# Patient Record
Sex: Female | Born: 1985
Health system: Southern US, Community
[De-identification: ages and names within clinical notes are randomized; demographics above are authoritative.]

## PROBLEM LIST (undated history)

## (undated) ENCOUNTER — Ambulatory Visit

## (undated) DIAGNOSIS — R509 Fever, unspecified: Secondary | ICD-10-CM

## (undated) HISTORY — PX: WISDOM TOOTH EXTRACTION: SHX21

---

## 2003-02-03 ENCOUNTER — Ambulatory Visit (HOSPITAL_COMMUNITY): Admission: RE | Admit: 2003-02-03 | Discharge: 2003-02-03 | Payer: Self-pay | Admitting: Family Medicine

## 2003-08-17 ENCOUNTER — Ambulatory Visit (HOSPITAL_COMMUNITY): Admission: RE | Admit: 2003-08-17 | Discharge: 2003-08-17 | Payer: Self-pay | Admitting: Family Medicine

## 2005-03-07 ENCOUNTER — Emergency Department (HOSPITAL_COMMUNITY): Admission: EM | Admit: 2005-03-07 | Discharge: 2005-03-07 | Payer: Self-pay | Admitting: Emergency Medicine

## 2005-04-04 ENCOUNTER — Inpatient Hospital Stay (HOSPITAL_COMMUNITY): Admission: AD | Admit: 2005-04-04 | Discharge: 2005-04-07 | Payer: Self-pay | Admitting: Obstetrics

## 2006-03-12 ENCOUNTER — Emergency Department (HOSPITAL_COMMUNITY): Admission: EM | Admit: 2006-03-12 | Discharge: 2006-03-12 | Payer: Self-pay | Admitting: Emergency Medicine

## 2006-05-01 ENCOUNTER — Inpatient Hospital Stay (HOSPITAL_COMMUNITY): Admission: AD | Admit: 2006-05-01 | Discharge: 2006-05-02 | Payer: Self-pay | Admitting: Obstetrics

## 2007-02-19 ENCOUNTER — Emergency Department (HOSPITAL_COMMUNITY): Admission: EM | Admit: 2007-02-19 | Discharge: 2007-02-19 | Payer: Self-pay | Admitting: Emergency Medicine

## 2007-12-04 ENCOUNTER — Emergency Department (HOSPITAL_COMMUNITY): Admission: EM | Admit: 2007-12-04 | Discharge: 2007-12-04 | Payer: Self-pay | Admitting: Emergency Medicine

## 2010-01-09 LAB — HEPATITIS B SURFACE ANTIGEN: Hepatitis B Surface Ag: NEGATIVE

## 2010-01-09 LAB — ABO/RH: RH Type: NEGATIVE

## 2010-01-09 LAB — RPR: RPR: NONREACTIVE

## 2010-01-21 ENCOUNTER — Inpatient Hospital Stay (HOSPITAL_COMMUNITY)
Admission: AD | Admit: 2010-01-21 | Discharge: 2010-01-21 | Payer: Self-pay | Source: Home / Self Care | Attending: Obstetrics | Admitting: Obstetrics

## 2010-02-04 NOTE — L&D Delivery Note (Signed)
Delivery Note At 4:17 PM a viable female was delivered via Vaginal, Spontaneous Delivery (Presentation: Left Occiput Anterior).  APGAR: 1, 8; weight 8 lb 2.7 oz (3705 g).   Placenta status: Intact, Spontaneous.  Cord: 3 vessels with the following complications: None.  Anesthesia: Epidural  Episiotomy: None Lacerations: None Suture Repair: none Est. Blood Loss (mL):   Mom to postpartum.  Baby to nursery-stable.  Bailey King A 08/14/2010, 4:41 PM

## 2010-04-16 LAB — URINALYSIS, ROUTINE W REFLEX MICROSCOPIC
Glucose, UA: NEGATIVE mg/dL
Hgb urine dipstick: NEGATIVE
Specific Gravity, Urine: 1.015 (ref 1.005–1.030)
pH: 7 (ref 5.0–8.0)

## 2010-04-16 LAB — CBC
HCT: 36.4 % (ref 36.0–46.0)
MCV: 87.7 fL (ref 78.0–100.0)
RBC: 4.15 MIL/uL (ref 3.87–5.11)
WBC: 10 10*3/uL (ref 4.0–10.5)

## 2010-04-16 LAB — WET PREP, GENITAL: Yeast Wet Prep HPF POC: NONE SEEN

## 2010-04-16 LAB — ABO/RH: ABO/RH(D): AB POS

## 2010-05-21 ENCOUNTER — Inpatient Hospital Stay (HOSPITAL_COMMUNITY)
Admission: AD | Admit: 2010-05-21 | Discharge: 2010-05-21 | Disposition: A | Discharge status: Home / Self Care | Payer: Medicaid Other | Source: Ambulatory Visit | Attending: Obstetrics | Admitting: Obstetrics

## 2010-05-21 DIAGNOSIS — O99891 Other specified diseases and conditions complicating pregnancy: Secondary | ICD-10-CM

## 2010-05-21 DIAGNOSIS — R0602 Shortness of breath: Secondary | ICD-10-CM

## 2010-05-21 DIAGNOSIS — O9989 Other specified diseases and conditions complicating pregnancy, childbirth and the puerperium: Secondary | ICD-10-CM

## 2010-08-07 ENCOUNTER — Inpatient Hospital Stay (HOSPITAL_COMMUNITY): Payer: Managed Care, Other (non HMO)

## 2010-08-07 ENCOUNTER — Inpatient Hospital Stay (HOSPITAL_COMMUNITY)
Admission: AD | Admit: 2010-08-07 | Discharge: 2010-08-07 | Disposition: A | Discharge status: Home / Self Care | Payer: Managed Care, Other (non HMO) | Source: Ambulatory Visit | Attending: Obstetrics | Admitting: Obstetrics

## 2010-08-07 DIAGNOSIS — O4690 Antepartum hemorrhage, unspecified, unspecified trimester: Secondary | ICD-10-CM | POA: Insufficient documentation

## 2010-08-13 ENCOUNTER — Inpatient Hospital Stay (HOSPITAL_COMMUNITY)
Admission: AD | Admit: 2010-08-13 | Discharge: 2010-08-16 | DRG: 775 | Disposition: A | Discharge status: Home / Self Care | Payer: Managed Care, Other (non HMO) | Source: Ambulatory Visit | Attending: Obstetrics | Admitting: Obstetrics

## 2010-08-13 ENCOUNTER — Inpatient Hospital Stay (HOSPITAL_COMMUNITY)
Admission: RE | Admit: 2010-08-13 | Discharge: 2010-08-13 | Disposition: A | Discharge status: Home / Self Care | Payer: Medicaid Other | Source: Ambulatory Visit | Attending: Obstetrics | Admitting: Obstetrics

## 2010-08-13 ENCOUNTER — Encounter (HOSPITAL_COMMUNITY): Payer: Self-pay | Admitting: *Deleted

## 2010-08-13 DIAGNOSIS — O479 False labor, unspecified: Secondary | ICD-10-CM | POA: Insufficient documentation

## 2010-08-13 DIAGNOSIS — R509 Fever, unspecified: Secondary | ICD-10-CM | POA: Diagnosis not present

## 2010-08-13 DIAGNOSIS — Z3490 Encounter for supervision of normal pregnancy, unspecified, unspecified trimester: Secondary | ICD-10-CM

## 2010-08-13 HISTORY — DX: Fever, unspecified: R50.9

## 2010-08-13 LAB — CBC
MCHC: 30.7 g/dL (ref 30.0–36.0)
MCV: 86.8 fL (ref 78.0–100.0)
Platelets: 306 10*3/uL (ref 150–400)
RDW: 14.1 % (ref 11.5–15.5)
WBC: 8.8 10*3/uL (ref 4.0–10.5)

## 2010-08-13 MED ORDER — OXYTOCIN 20 UNITS IN LACTATED RINGERS INFUSION - SIMPLE
2.0000 m[IU]/min | INTRAVENOUS | Status: DC
  Administered 2010-08-14 (×2): 2 m[IU]/min via INTRAVENOUS
  Administered 2010-08-14: 4 m[IU]/min via INTRAVENOUS
  Filled 2010-08-13: qty 1000

## 2010-08-13 MED ORDER — EPHEDRINE 5 MG/ML INJ
10.0000 mg | INTRAVENOUS | Status: DC | PRN
  Administered 2010-08-14: 10 mg via INTRAVENOUS
  Filled 2010-08-13 (×2): qty 4

## 2010-08-13 MED ORDER — DIPHENHYDRAMINE HCL 50 MG/ML IJ SOLN: 12.5000 mg | INTRAMUSCULAR | Status: DC | PRN

## 2010-08-13 MED ORDER — FENTANYL 2.5 MCG/ML BUPIVACAINE 1/10 % EPIDURAL INFUSION (WH - ANES)
2.0000 mL/h | INTRAMUSCULAR | Status: DC
  Administered 2010-08-14 (×4): 14 mL/h via EPIDURAL
  Filled 2010-08-13 (×4): qty 60

## 2010-08-13 MED ORDER — PHENYLEPHRINE 40 MCG/ML (10ML) SYRINGE FOR IV PUSH (FOR BLOOD PRESSURE SUPPORT)
80.0000 ug | PREFILLED_SYRINGE | INTRAVENOUS | Status: DC | PRN
  Filled 2010-08-13: qty 5

## 2010-08-13 MED ORDER — NALBUPHINE SYRINGE 5 MG/0.5 ML
10.0000 mg | INJECTION | INTRAMUSCULAR | Status: DC | PRN
  Filled 2010-08-13: qty 1

## 2010-08-13 MED ORDER — EPHEDRINE 5 MG/ML INJ
10.0000 mg | INTRAVENOUS | Status: DC | PRN
  Filled 2010-08-13: qty 4

## 2010-08-13 MED ORDER — LACTATED RINGERS IV SOLN
500.0000 mL | Freq: Once | INTRAVENOUS | Status: AC
  Administered 2010-08-14: 500 mL via INTRAVENOUS

## 2010-08-13 MED ORDER — PHENYLEPHRINE 40 MCG/ML (10ML) SYRINGE FOR IV PUSH (FOR BLOOD PRESSURE SUPPORT)
80.0000 ug | PREFILLED_SYRINGE | INTRAVENOUS | Status: DC | PRN
  Filled 2010-08-13 (×2): qty 5

## 2010-08-13 MED ORDER — NALBUPHINE HCL 10 MG/ML IJ SOLN: 10.0000 mg | INTRAMUSCULAR | Status: DC | PRN

## 2010-08-13 NOTE — H&P (Signed)
This is Dr. Francoise Ceo dictating the history and physical on Bailey King She's a gravida 2 para line all old one [facility] 08/13/2010 she has no known allergies and she was admitted for induction Her GBS is negative On admission has been having irregular contractions every 3-4 out metastases Cervix is 2 cm 80% and vertex -2 station Membranes ruptured artificially the fluid was clear Past medical history negative  Social history patient denies smoking drinking or alcohol abuse Family history negative System review negative Physical exam HEENT negative age Lungs clear to PA Breasts negative Heart regular in rhythm no murmurs no gallops no rubs Abdomen term Pelvic cervix is 2 cm percent vertex -2 station membranes ruptured artificially fluid to Extremities negative

## 2010-08-14 ENCOUNTER — Encounter (HOSPITAL_COMMUNITY): Payer: Self-pay | Admitting: *Deleted

## 2010-08-14 ENCOUNTER — Other Ambulatory Visit: Payer: Self-pay | Admitting: Obstetrics

## 2010-08-14 ENCOUNTER — Encounter (HOSPITAL_COMMUNITY): Payer: Self-pay | Admitting: Anesthesiology

## 2010-08-14 ENCOUNTER — Inpatient Hospital Stay (HOSPITAL_COMMUNITY): Payer: Managed Care, Other (non HMO) | Admitting: Anesthesiology

## 2010-08-14 DIAGNOSIS — R509 Fever, unspecified: Secondary | ICD-10-CM | POA: Diagnosis not present

## 2010-08-14 DIAGNOSIS — Z3490 Encounter for supervision of normal pregnancy, unspecified, unspecified trimester: Secondary | ICD-10-CM

## 2010-08-14 HISTORY — DX: Fever, unspecified: R50.9

## 2010-08-14 LAB — STREP B DNA PROBE
GBS: NEGATIVE
GBS: NEGATIVE

## 2010-08-14 MED ORDER — DIPHENHYDRAMINE HCL 25 MG PO CAPS: 25.0000 mg | ORAL_CAPSULE | Freq: Four times a day (QID) | ORAL | Status: DC | PRN

## 2010-08-14 MED ORDER — SODIUM CHLORIDE 0.9 % IV SOLN
2.0000 g | Freq: Four times a day (QID) | INTRAVENOUS | Status: DC
  Filled 2010-08-14: qty 2000

## 2010-08-14 MED ORDER — SODIUM CHLORIDE 0.9 % IJ SOLN: 3.0000 mL | Freq: Two times a day (BID) | INTRAMUSCULAR | Status: DC

## 2010-08-14 MED ORDER — ACETAMINOPHEN 160 MG/5ML PO SOLN
650.0000 mg | Freq: Four times a day (QID) | ORAL | Status: DC | PRN
  Administered 2010-08-14: 649.6 mg via ORAL
  Filled 2010-08-14: qty 20.3

## 2010-08-14 MED ORDER — RHO D IMMUNE GLOBULIN 1500 UNIT/2ML IJ SOLN: 300.0000 ug | Freq: Once | INTRAMUSCULAR | Status: DC

## 2010-08-14 MED ORDER — BENZOCAINE-MENTHOL 20-0.5 % EX AERO: 1.0000 "application " | INHALATION_SPRAY | CUTANEOUS | Status: DC | PRN

## 2010-08-14 MED ORDER — SIMETHICONE 80 MG PO CHEW: 80.0000 mg | CHEWABLE_TABLET | ORAL | Status: DC | PRN

## 2010-08-14 MED ORDER — LACTATED RINGERS IV SOLN: 500.0000 mL | INTRAVENOUS | Status: DC | PRN

## 2010-08-14 MED ORDER — WITCH HAZEL-GLYCERIN EX PADS: MEDICATED_PAD | CUTANEOUS | Status: DC | PRN

## 2010-08-14 MED ORDER — LIDOCAINE HCL 1.5 % IJ SOLN
INTRAMUSCULAR | Status: DC | PRN
  Administered 2010-08-14 (×2): 5 mL
  Administered 2010-08-14: 2 mL

## 2010-08-14 MED ORDER — SODIUM CHLORIDE 0.9 % IV SOLN
250.0000 mL | INTRAVENOUS | Status: DC
  Administered 2010-08-15: 250 mL via INTRAVENOUS

## 2010-08-14 MED ORDER — OXYCODONE-ACETAMINOPHEN 5-325 MG PO TABS: 1.0000 | ORAL_TABLET | ORAL | Status: DC | PRN

## 2010-08-14 MED ORDER — ZOLPIDEM TARTRATE 5 MG PO TABS: 5.0000 mg | ORAL_TABLET | Freq: Every evening | ORAL | Status: DC | PRN

## 2010-08-14 MED ORDER — OXYTOCIN 20 UNITS IN LACTATED RINGERS INFUSION - SIMPLE: 125.0000 mL/h | Freq: Once | INTRAVENOUS | Status: DC

## 2010-08-14 MED ORDER — FERROUS SULFATE 325 (65 FE) MG PO TABS
325.0000 mg | ORAL_TABLET | Freq: Two times a day (BID) | ORAL | Status: DC
  Filled 2010-08-14 (×2): qty 1

## 2010-08-14 MED ORDER — TETANUS-DIPHTH-ACELL PERTUSSIS 5-2.5-18.5 LF-MCG/0.5 IM SUSP
0.5000 mL | Freq: Once | INTRAMUSCULAR | Status: DC
  Filled 2010-08-14: qty 0.5

## 2010-08-14 MED ORDER — OXYTOCIN 20 UNITS IN LACTATED RINGERS INFUSION - SIMPLE: 125.0000 mL/h | INTRAVENOUS | Status: DC | PRN

## 2010-08-14 MED ORDER — ONDANSETRON HCL 4 MG/2ML IJ SOLN
INTRAMUSCULAR | Status: AC
  Administered 2010-08-14: 4 mg
  Filled 2010-08-14: qty 2

## 2010-08-14 MED ORDER — PRENATAL PLUS 27-1 MG PO TABS
1.0000 | ORAL_TABLET | Freq: Every day | ORAL | Status: DC
  Filled 2010-08-14: qty 1

## 2010-08-14 MED ORDER — IBUPROFEN 600 MG PO TABS: 600.0000 mg | ORAL_TABLET | Freq: Four times a day (QID) | ORAL | Status: DC

## 2010-08-14 MED ORDER — LIDOCAINE HCL (PF) 1 % IJ SOLN
30.0000 mL | Freq: Once | INTRAMUSCULAR | Status: AC | PRN
  Filled 2010-08-14 (×2): qty 30

## 2010-08-14 MED ORDER — ONDANSETRON HCL 4 MG PO TABS: 4.0000 mg | ORAL_TABLET | ORAL | Status: DC | PRN

## 2010-08-14 MED ORDER — CITRIC ACID-SODIUM CITRATE 334-500 MG/5ML PO SOLN: 30.0000 mL | ORAL | Status: DC | PRN

## 2010-08-14 MED ORDER — SODIUM CHLORIDE 0.9 % IV SOLN
2.0000 g | Freq: Four times a day (QID) | INTRAVENOUS | Status: DC
  Administered 2010-08-14: 2 g via INTRAVENOUS
  Filled 2010-08-14 (×2): qty 2000

## 2010-08-14 MED ORDER — ACETAMINOPHEN 325 MG PO TABS: 650.0000 mg | ORAL_TABLET | ORAL | Status: DC | PRN

## 2010-08-14 MED ORDER — METHYLERGONOVINE MALEATE 0.2 MG PO TABS: 0.2000 mg | ORAL_TABLET | ORAL | Status: DC | PRN

## 2010-08-14 MED ORDER — LACTATED RINGERS IV SOLN
INTRAVENOUS | Status: DC
  Administered 2010-08-14: 15:00:00 via INTRAVENOUS

## 2010-08-14 MED ORDER — IBUPROFEN 600 MG PO TABS: 600.0000 mg | ORAL_TABLET | Freq: Four times a day (QID) | ORAL | Status: DC | PRN

## 2010-08-14 MED ORDER — ONDANSETRON HCL 4 MG/2ML IJ SOLN: 4.0000 mg | INTRAMUSCULAR | Status: DC | PRN

## 2010-08-14 MED ORDER — SENNOSIDES-DOCUSATE SODIUM 8.6-50 MG PO TABS: 1.0000 | ORAL_TABLET | Freq: Every day | ORAL | Status: DC

## 2010-08-14 MED ORDER — ONDANSETRON HCL 4 MG/2ML IJ SOLN: 4.0000 mg | Freq: Four times a day (QID) | INTRAMUSCULAR | Status: DC | PRN

## 2010-08-14 MED ORDER — AMPICILLIN SODIUM 2 G IJ SOLR
2.0000 g | Freq: Four times a day (QID) | INTRAMUSCULAR | Status: DC
  Administered 2010-08-15: 2 g via INTRAVENOUS

## 2010-08-14 MED ORDER — FLEET ENEMA 7-19 GM/118ML RE ENEM: 1.0000 | ENEMA | RECTAL | Status: DC | PRN

## 2010-08-14 MED ORDER — ACETAMINOPHEN 325 MG PO TABS: 650.0000 mg | ORAL_TABLET | Freq: Four times a day (QID) | ORAL | Status: DC | PRN

## 2010-08-14 MED ORDER — SODIUM CHLORIDE 0.9 % IJ SOLN
3.0000 mL | INTRAMUSCULAR | Status: DC | PRN
  Administered 2010-08-15 (×3): 3 mL via INTRAVENOUS

## 2010-08-14 NOTE — Anesthesia Procedure Notes (Signed)
Epidural Patient location during procedure: OB Start time: 08/14/2010 3:02 AM Reason for block: procedure for pain  Staffing Anesthesiologist: Kamaria Lucia L. Performed by: anesthesiologist   Preanesthetic Checklist Completed: patient identified, site marked, surgical consent, pre-op evaluation, timeout performed, IV checked, risks and benefits discussed and monitors and equipment checked  Epidural Patient position: sitting Prep: site prepped and draped and DuraPrep Patient monitoring: blood pressure, continuous pulse ox and heart rate Approach: midline Injection technique: LOR air  Needle Needle type: Tuohy  Needle gauge: 17 G Needle length: 9 cm Catheter type: closed end flexible Catheter size: 19 Gauge Test dose: 1.5% lidocaine  Assessment Events: blood not aspirated, injection not painful, no injection resistance, negative IV test and no paresthesia  Additional Notes Discussed risk of headache, infection, bleeding, nerve injury and failed or incomplete block.  Patient voices understanding and wishes to proceed.

## 2010-08-14 NOTE — Anesthesia Preprocedure Evaluation (Signed)
Anesthesia Evaluation  Name, MR# and DOBGeneral Assessment Comment  Reviewed: Allergy & Precautions, H&P  and Patient's Chart, lab work & pertinent test results  History of Anesthesia Complications Negative for: history of anesthetic complications  Airway Mallampati: II      Dental   Pulmonaryneg pulmonary ROS    clear to auscultation  pulmonary exam normal   Cardiovascular regular Normal   Neuro/PsychNegative Neurological ROS Negative Psych ROS  GI/Hepatic/Renal negative GI ROS, negative Liver ROS, and negative Renal ROS (+)       Endo/Other  Negative Endocrine ROS (+)   Abdominal   Musculoskeletal  Hematology negative hematology ROS (+)   Peds  Reproductive/Obstetrics (+) Pregnancy          Anesthesia Physical Anesthesia Plan  ASA: II  Anesthesia Plan: Epidural   Post-op Pain Management:    Induction:   Airway Management Planned:   Additional Equipment:   Intra-op Plan:   Post-operative Plan:   Informed Consent: I have reviewed the patients History and Physical, chart, labs and discussed the procedure including the risks, benefits and alternatives for the proposed anesthesia with the patient or authorized representative who has indicated his/her understanding and acceptance.     Plan Discussed with:   Anesthesia Plan Comments:         Anesthesia Quick Evaluation

## 2010-08-14 NOTE — Progress Notes (Signed)
PT states she is unable to swallow any pills, conferred w/ Delores, pharmacist an she will send oral tylenol dose.

## 2010-08-15 LAB — CBC
HCT: 24.6 % — ABNORMAL LOW (ref 36.0–46.0)
MCH: 26.9 pg (ref 26.0–34.0)
MCHC: 31.3 g/dL (ref 30.0–36.0)
MCV: 86 fL (ref 78.0–100.0)
RDW: 14.3 % (ref 11.5–15.5)

## 2010-08-15 MED ORDER — FENTANYL 2.5 MCG/ML BUPIVACAINE 1/10 % EPIDURAL INFUSION (WH - ANES): 2.0000 mL/h | INTRAMUSCULAR | Status: DC

## 2010-08-15 MED ORDER — IBUPROFEN 100 MG/5ML PO SUSP
600.0000 mg | Freq: Four times a day (QID) | ORAL | Status: DC
  Administered 2010-08-15 – 2010-08-16 (×6): 600 mg via ORAL
  Filled 2010-08-15 (×11): qty 30

## 2010-08-15 MED ORDER — SODIUM CHLORIDE 0.9 % IV SOLN
2.0000 g | Freq: Four times a day (QID) | INTRAVENOUS | Status: DC
  Administered 2010-08-15 (×2): 2 g via INTRAVENOUS
  Filled 2010-08-15 (×4): qty 2000

## 2010-08-15 NOTE — Progress Notes (Cosign Needed)
UR chart review completed.  

## 2010-08-15 NOTE — Progress Notes (Signed)
  Postpartum day one Vital signs normal Fundus firm Lochia moderate Legs negative

## 2010-08-16 ENCOUNTER — Encounter (HOSPITAL_COMMUNITY): Payer: Self-pay | Admitting: Obstetrics

## 2010-08-16 MED ORDER — ACETAMINOPHEN-CODEINE 300-30 MG PO TABS: 1.0000 | ORAL_TABLET | ORAL | Status: AC | PRN

## 2010-08-16 MED ORDER — BENZOCAINE-MENTHOL 20-0.5 % EX AERO: 1.0000 "application " | INHALATION_SPRAY | CUTANEOUS | Status: DC | PRN

## 2010-08-16 MED ORDER — NATALCARE PIC 60-1 MG PO TABS: 1.0000 | ORAL_TABLET | Freq: Every day | ORAL | Status: DC

## 2010-08-16 MED ORDER — FERROUS SULFATE 325 (65 FE) MG PO TABS: 325.0000 mg | ORAL_TABLET | Freq: Two times a day (BID) | ORAL | Status: DC

## 2010-08-16 NOTE — Progress Notes (Signed)
Post Partum Day 2 Subjective: no complaints  Objective: Blood pressure 100/62, pulse 65, temperature 97.6 F (36.4 C), temperature source Oral, resp. rate 18, height 5\' 2"  (1.575 m), weight 61.236 kg (135 lb), unknown if currently breastfeeding.  Physical Exam:  General: alert Lochia: appropriate Uterine Fundus: firm Incision: none DVT Evaluation: No evidence of DVT seen on physical exam.   Basename 08/15/10 0510 08/13/10 2245  HGB 7.7* 8.7*  HCT 24.6* 28.3*    Assessment/Plan: Discharge home   LOS: 3 days   Wynne Rozak A 08/16/2010, 6:40 AM

## 2010-08-16 NOTE — Discharge Summary (Signed)
Obstetric Discharge Summary Reason for Admission: onset of labor Prenatal Procedures: none Intrapartum Procedures: spontaneous vaginal delivery Postpartum Procedures: none Complications-Operative and Postpartum: none  Hemoglobin  Date Value Range Status  08/15/2010 7.7* 12.0-15.0 (g/dL) Final     HCT  Date Value Range Status  08/15/2010 24.6* 36.0-46.0 (%) Final    Discharge Diagnoses: Term Pregnancy-delivered  Discharge Information: Date: 08/16/2010 Activity: pelvic rest Diet: routine Medications: Tylenol #3 and Iron Condition: stable Instructions: refer to practice specific booklet Discharge to: home Follow-up Information    Follow up with MARSHALL,BERNARD A. Call in 6 weeks.   Contact information:   [address] [place] [address] [phone]          Newborn Data: Live born  Information for the patient's newborn:  Samani, Deal [[id number]]  female   Home with mother.  MARSHALL,BERNARD A 08/16/2010, 6:43 AM

## 2010-08-30 NOTE — Anesthesia Postprocedure Evaluation (Signed)
  Anesthesia Post-op Note  Patient: Bailey King  Procedure(s) Performed: * No procedures listed *  Patient stable following vaginal delivery.

## 2010-10-26 LAB — URINALYSIS, ROUTINE W REFLEX MICROSCOPIC
Bilirubin Urine: NEGATIVE
Glucose, UA: NEGATIVE
Hgb urine dipstick: NEGATIVE
Ketones, ur: NEGATIVE
Protein, ur: NEGATIVE

## 2011-01-07 ENCOUNTER — Other Ambulatory Visit: Payer: Self-pay | Admitting: Obstetrics and Gynecology

## 2012-01-23 ENCOUNTER — Emergency Department (HOSPITAL_BASED_OUTPATIENT_CLINIC_OR_DEPARTMENT_OTHER): Payer: Managed Care, Other (non HMO)

## 2012-01-23 ENCOUNTER — Encounter (HOSPITAL_BASED_OUTPATIENT_CLINIC_OR_DEPARTMENT_OTHER): Payer: Self-pay | Admitting: *Deleted

## 2012-01-23 ENCOUNTER — Emergency Department (HOSPITAL_BASED_OUTPATIENT_CLINIC_OR_DEPARTMENT_OTHER)
Admission: EM | Admit: 2012-01-23 | Discharge: 2012-01-23 | Disposition: A | Discharge status: Home / Self Care | Payer: Managed Care, Other (non HMO) | Attending: Emergency Medicine | Admitting: Emergency Medicine

## 2012-01-23 DIAGNOSIS — J40 Bronchitis, not specified as acute or chronic: Secondary | ICD-10-CM | POA: Insufficient documentation

## 2012-01-23 DIAGNOSIS — B9789 Other viral agents as the cause of diseases classified elsewhere: Secondary | ICD-10-CM | POA: Insufficient documentation

## 2012-01-23 DIAGNOSIS — B349 Viral infection, unspecified: Secondary | ICD-10-CM

## 2012-01-23 DIAGNOSIS — Z79899 Other long term (current) drug therapy: Secondary | ICD-10-CM | POA: Insufficient documentation

## 2012-01-23 DIAGNOSIS — R5381 Other malaise: Secondary | ICD-10-CM | POA: Insufficient documentation

## 2012-01-23 DIAGNOSIS — IMO0001 Reserved for inherently not codable concepts without codable children: Secondary | ICD-10-CM | POA: Insufficient documentation

## 2012-01-23 LAB — BASIC METABOLIC PANEL
BUN: 9 mg/dL (ref 6–23)
Chloride: 98 mEq/L (ref 96–112)
Glucose, Bld: 99 mg/dL (ref 70–99)
Potassium: 3.9 mEq/L (ref 3.5–5.1)

## 2012-01-23 LAB — CBC WITH DIFFERENTIAL/PLATELET
Eosinophils Absolute: 0 10*3/uL (ref 0.0–0.7)
HCT: 36.8 % (ref 36.0–46.0)
Hemoglobin: 12.1 g/dL (ref 12.0–15.0)
Lymphs Abs: 1.5 10*3/uL (ref 0.7–4.0)
MCH: 29.7 pg (ref 26.0–34.0)
Monocytes Relative: 10 % (ref 3–12)
Neutro Abs: 5.6 10*3/uL (ref 1.7–7.7)
Neutrophils Relative %: 71 % (ref 43–77)
RBC: 4.07 MIL/uL (ref 3.87–5.11)

## 2012-01-23 LAB — URINE MICROSCOPIC-ADD ON

## 2012-01-23 LAB — URINALYSIS, ROUTINE W REFLEX MICROSCOPIC
Glucose, UA: NEGATIVE mg/dL
Ketones, ur: 15 mg/dL — AB
pH: 7 (ref 5.0–8.0)

## 2012-01-23 MED ORDER — HYDROCODONE-ACETAMINOPHEN 7.5-500 MG/15ML PO SOLN: 15.0000 mL | Freq: Four times a day (QID) | ORAL | Status: DC | PRN

## 2012-01-23 MED ORDER — SODIUM CHLORIDE 0.9 % IV BOLUS (SEPSIS)
1000.0000 mL | Freq: Once | INTRAVENOUS | Status: AC
  Administered 2012-01-23: 1000 mL via INTRAVENOUS

## 2012-01-23 MED ORDER — KETOROLAC TROMETHAMINE 30 MG/ML IJ SOLN
30.0000 mg | Freq: Once | INTRAMUSCULAR | Status: AC
  Administered 2012-01-23: 30 mg via INTRAVENOUS
  Filled 2012-01-23: qty 1

## 2012-01-23 NOTE — ED Notes (Signed)
Has been treated at [facility] 2 times this week for UTI. Here with fever, back pain, and chills.

## 2012-01-23 NOTE — ED Notes (Signed)
MD at bedside. 

## 2012-01-23 NOTE — ED Notes (Signed)
Patient transported to X-ray via stretcher 

## 2012-01-23 NOTE — ED Provider Notes (Signed)
History     CSN: [id number]  Arrival date & time 01/23/12  4098   First MD Initiated Contact with Patient 01/23/12 1931      Chief Complaint  Patient presents with  . Urinary Tract Infection    (Consider location/radiation/quality/duration/timing/severity/associated sxs/prior treatment) HPI Pt reports 4-5 days of general malaise, diffuse myalgias and dry cough. She was seen at [facility] earlier this week and started on Abx for UTI. She reports she was having some mild dysuria at that time. She was still feeling bad so she went back to [facility] yesterday and was switched to Macrobid. She is no better today, but denies any further dysuria. She continues to have subjective fever, not improved with meds at home, moderate to severe aching diffuse back pain. Denies vomiting, diarrhea or sore throat.    Past Medical History  Diagnosis Date  . NVD (normal vaginal delivery) 08/16/2010  . Fever of unknown origin 08/14/2010    Past Surgical History  Procedure Date  . Wisdom tooth extraction     No family history on file.  History  Substance Use Topics  . Smoking status: Never Smoker   . Smokeless tobacco: Not on file  . Alcohol Use: No    OB History    Grav Para Term Preterm Abortions TAB SAB Ect Mult Living   2 2 2  0      2      Review of Systems All other systems reviewed and are negative except as noted in HPI.   Allergies  Review of patient's allergies indicates no known allergies.  Home Medications   Current Outpatient Rx  Name  Route  Sig  Dispense  Refill  . NITROFURANTOIN MACROCRYSTAL 100 MG PO CAPS   Oral   Take 100 mg by mouth 4 (four) times daily.         Marland Kitchen BENZOCAINE-MENTHOL 20-0.5 % EX AERO   Topical   Apply 1 application topically as needed (perineal discomfort).         Di Kindle SULFATE 325 (65 FE) MG PO TABS   Oral   Take 1 tablet (325 mg total) by mouth 2 (two) times daily with a meal.         . NATALCARE PIC 60-1 MG PO TABS   Oral  Take 1 tablet by mouth daily with breakfast.            BP 112/71  Pulse 120  Temp 99.5 F (37.5 C) (Oral)  Resp 18  Ht 5\' 3"  (1.6 m)  Wt 119 lb (53.978 kg)  BMI 21.08 kg/m2  SpO2 98%  Breastfeeding? Unknown  Physical Exam  Nursing note and vitals reviewed. Constitutional: She is oriented to person, place, and time. She appears well-developed and well-nourished.  HENT:  Head: Normocephalic and atraumatic.  Eyes: EOM are normal. Pupils are equal, round, and reactive to light.  Neck: Normal range of motion. Neck supple.  Cardiovascular: Normal heart sounds and intact distal pulses.        Tachycardia  Pulmonary/Chest: Effort normal and breath sounds normal.  Abdominal: Bowel sounds are normal. She exhibits no distension. There is no tenderness.  Musculoskeletal: Normal range of motion. She exhibits no edema and no tenderness.  Neurological: She is alert and oriented to person, place, and time. She has normal strength. No cranial nerve deficit or sensory deficit.  Skin: Skin is warm and dry. No rash noted.  Psychiatric: She has a normal mood and affect.    ED Course  Procedures (including critical care time)  Labs Reviewed  URINALYSIS, ROUTINE W REFLEX MICROSCOPIC - Abnormal; Notable for the following:    Color, Urine AMBER (*)  BIOCHEMICALS MAY BE AFFECTED BY COLOR   APPearance CLOUDY (*)     Hgb urine dipstick TRACE (*)     Ketones, ur 15 (*)     Protein, ur 100 (*)     Urobilinogen, UA 2.0 (*)     Leukocytes, UA TRACE (*)     All other components within normal limits  URINE MICROSCOPIC-ADD ON - Abnormal; Notable for the following:    Squamous Epithelial / LPF FEW (*)     Bacteria, UA FEW (*)     All other components within normal limits  PREGNANCY, URINE  CBC WITH DIFFERENTIAL  BASIC METABOLIC PANEL   Dg Chest 2 View  01/23/2012  *RADIOLOGY REPORT*  Clinical Data: Cough, congestion, fever  CHEST - 2 VIEW  Comparison: Prior chest x-ray 03/12/2006  Findings:  Diffuse central airway thickening with peribronchial cuffing and bilateral perihilar atelectasis.  No focal airspace consolidation.  Negative for edema, pneumothorax or pleural effusion.  Osseous structures are unremarkable.  Cardiac and mediastinal contours within normal limits.  IMPRESSION:  Nonspecific findings of central airway thickening, peribronchial cuffing and perihilar atelectasis as can be seen in both acute and chronic bronchitis, viral upper respiratory infection and inflammatory conditions such as asthma.   Original Report Authenticated By: Malachy Moan, M.D.      No diagnosis found.    MDM  UA neg here, unable to confirm diagnosis of UTI with [facility] as they are closed. Advised patient to continue Macrobid. CXR shows bronchitis, likely source of her symptoms is viral syndrome. No evidence of UTI or pyelo here. Advised supportive measures at home.         Charles B. Bernette Mayers, MD 01/23/12 2108

## 2013-04-27 IMAGING — US US FETAL BPP W/O NONSTRESS
1 series · 6 of 6 positions shown · non-contrast
Comparison: none

[Series 1: us fetal bpp w/o nonstress · non-contrast · 6 acquisitions, 6 frames shown]
[im 1/6]
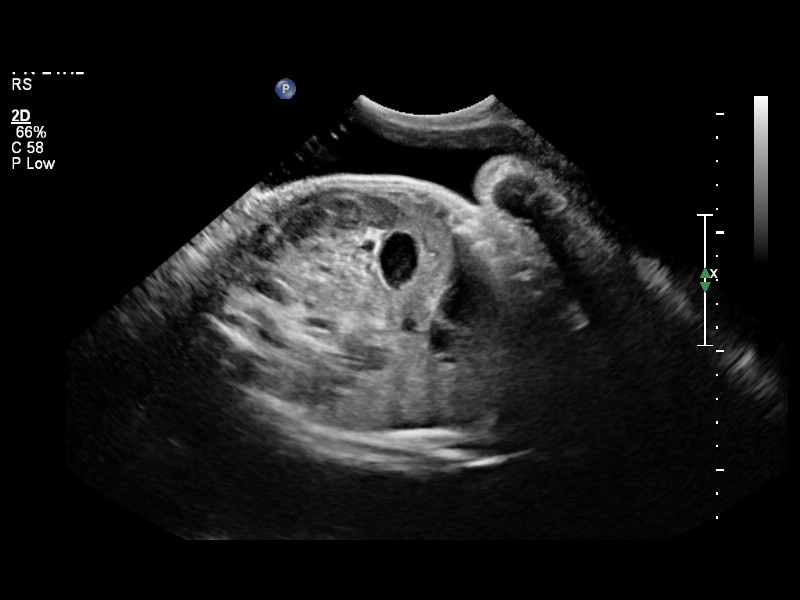
[im 2/6]
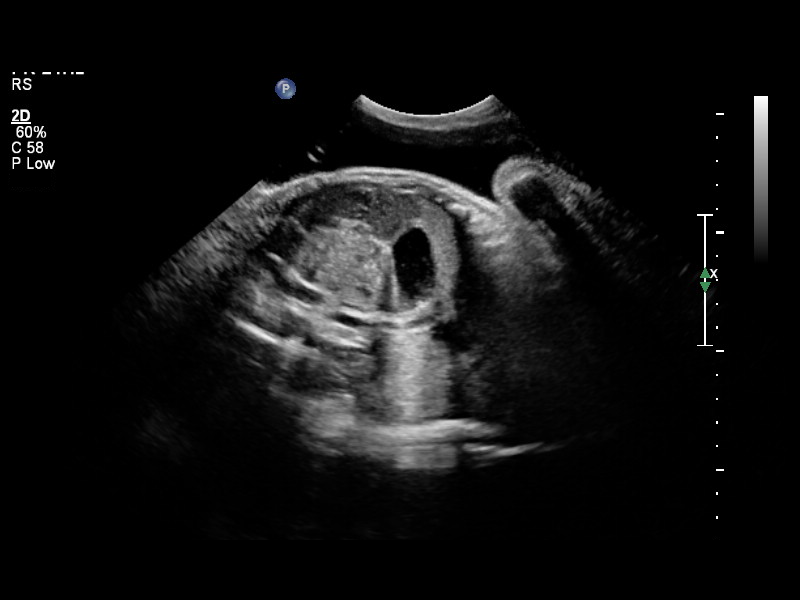
[im 3/6]
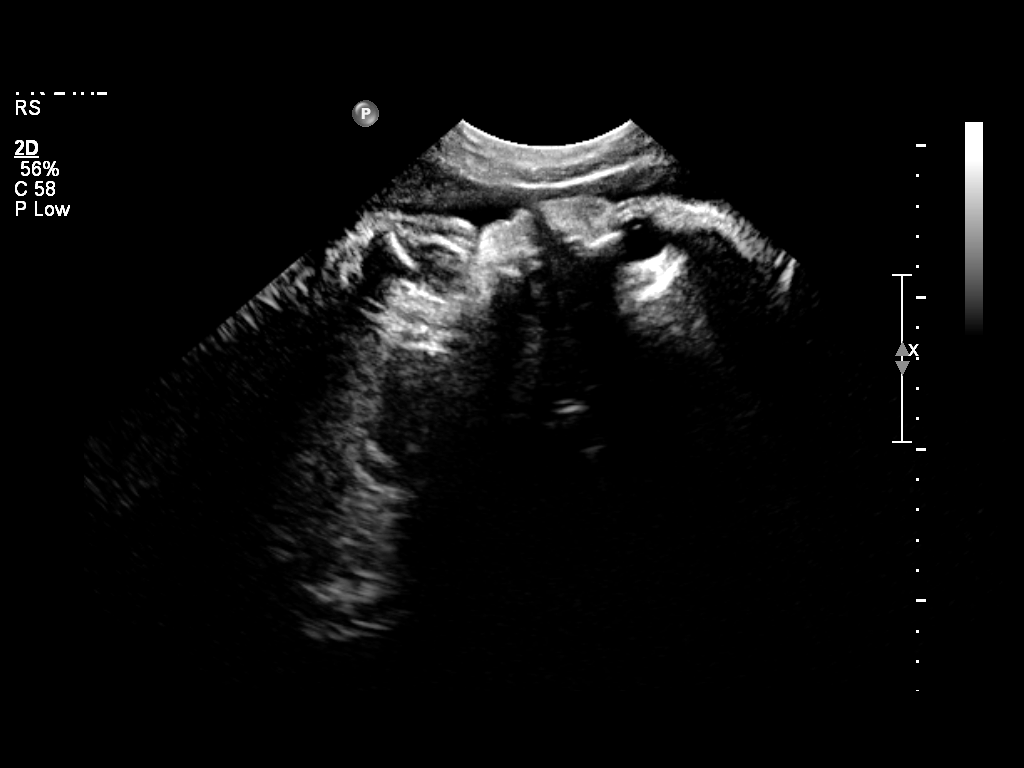
[im 4/6]
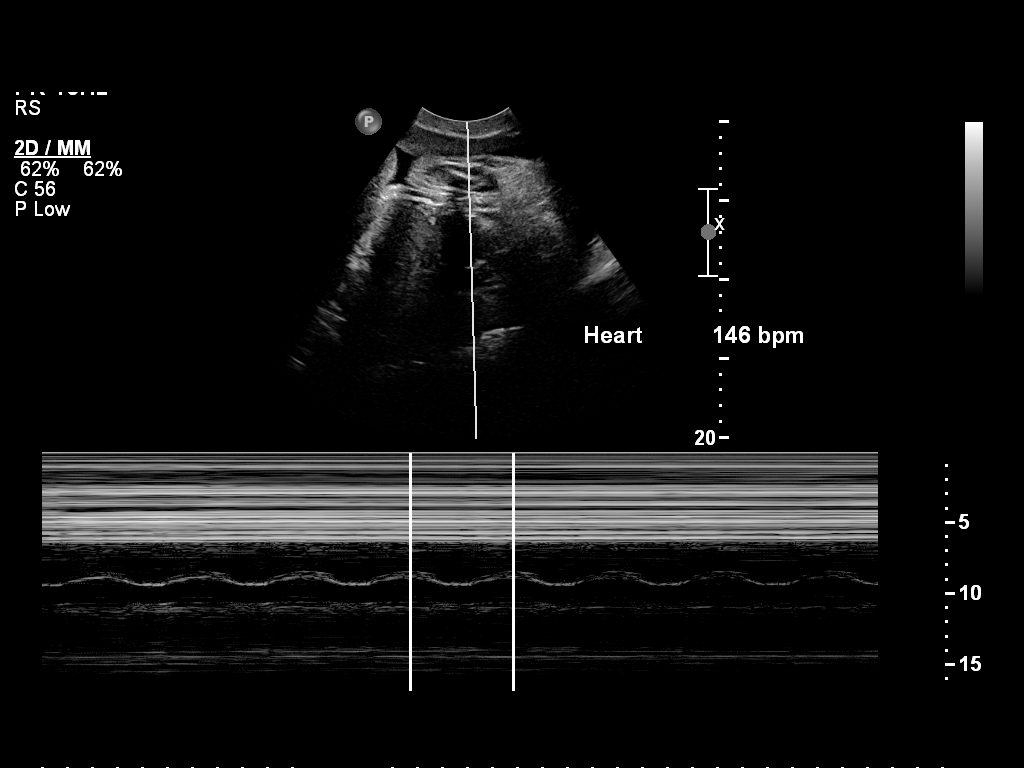
[im 5/6]
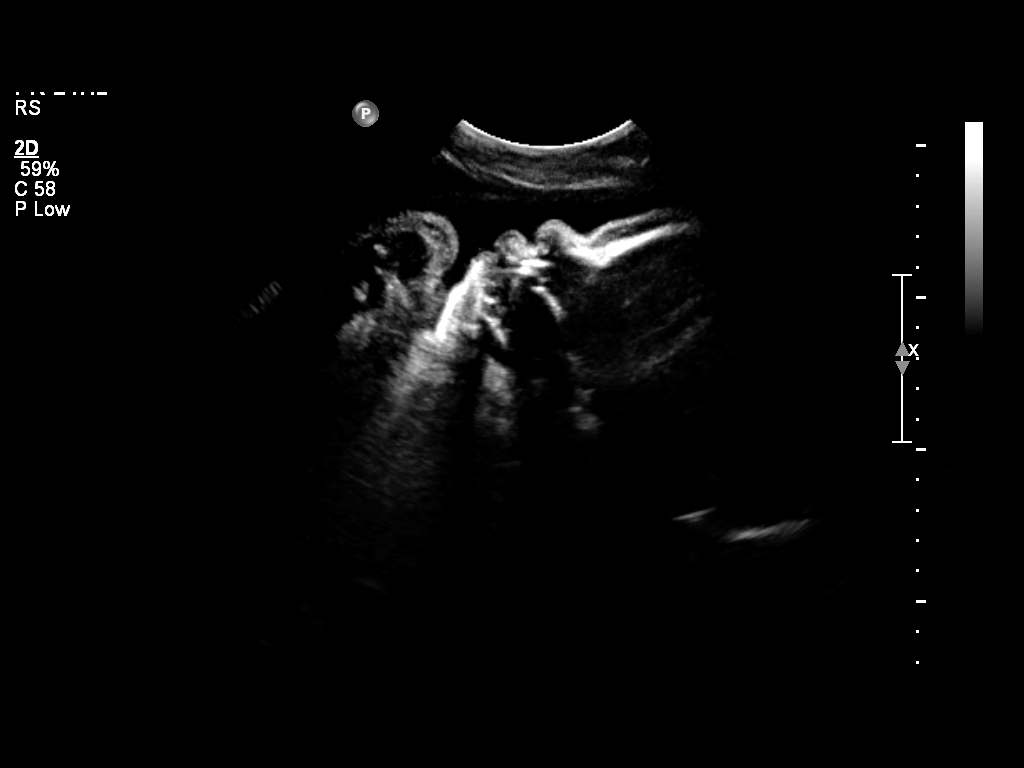
[im 6/6]
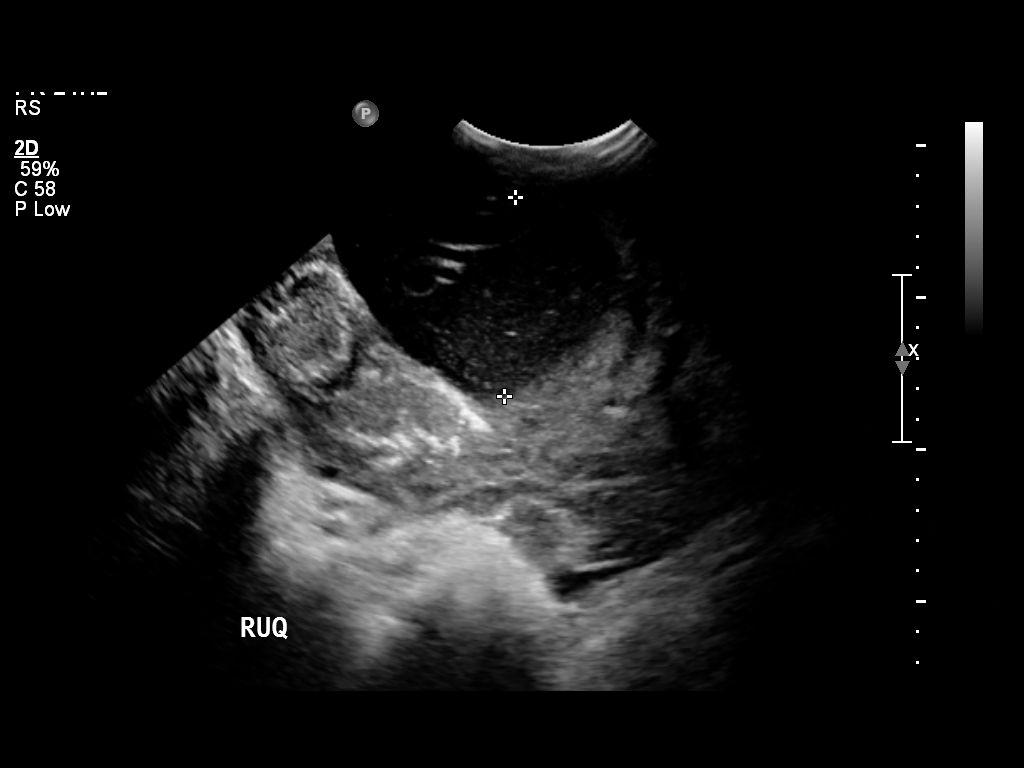

[6 of 6 positions shown; findings below may reference images not displayed]

OBSTETRICS REPORT
                      (Signed Final 08/07/2010 [DATE])

Procedures

Indications

 Vaginal bleeding, unknown etiology
 Assess fetal well being
Fetal Evaluation

 Fetal Heart Rate:  146                          bpm
 Cardiac Activity:  Observed
 Presentation:      Cephalic
 Placenta:          Posterior

 Amniotic Fluid
 AFI FV:      Subjectively within normal limits
                                             Larg Pckt:     6.5  cm
Biophysical Evaluation

 Amniotic F.V:   Within normal limits       F. Tone:        Observed
 F. Movement:    Observed                   Score:          [DATE]
 F. Breathing:   Observed
Gestational Age

 LMP:           39w 1d        Date:  11/06/09                 EDD:   08/13/10
 Best:          39w 1d     Det. By:  LMP  (11/06/09)          EDD:   08/13/10
Cervix Uterus Adnexa

 Cervix:       Not visualized (advanced GA >34 wks)
Impression

 BPP [DATE].

 Subjectively normal amniotic fluid volume with a single pocket
 measurement of > 2 x 2 cm noted.
 questions or concerns.

## 2013-12-06 ENCOUNTER — Encounter (HOSPITAL_BASED_OUTPATIENT_CLINIC_OR_DEPARTMENT_OTHER): Payer: Self-pay | Admitting: *Deleted

## 2014-03-31 ENCOUNTER — Ambulatory Visit (INDEPENDENT_AMBULATORY_CARE_PROVIDER_SITE_OTHER): Payer: Managed Care, Other (non HMO) | Admitting: Emergency Medicine

## 2014-03-31 VITALS — BP 130/78 | HR 86 | Temp 99.0°F | Resp 12 | Ht 64.0 in | Wt 134.0 lb

## 2014-03-31 DIAGNOSIS — N3000 Acute cystitis without hematuria: Secondary | ICD-10-CM

## 2014-03-31 DIAGNOSIS — R1030 Lower abdominal pain, unspecified: Secondary | ICD-10-CM

## 2014-03-31 DIAGNOSIS — R300 Dysuria: Secondary | ICD-10-CM

## 2014-03-31 DIAGNOSIS — R109 Unspecified abdominal pain: Secondary | ICD-10-CM

## 2014-03-31 LAB — POCT URINALYSIS DIPSTICK
Bilirubin, UA: NEGATIVE
Glucose, UA: NEGATIVE
KETONES UA: NEGATIVE
Nitrite, UA: NEGATIVE
Protein, UA: NEGATIVE
SPEC GRAV UA: 1.02
UROBILINOGEN UA: 1
pH, UA: 7

## 2014-03-31 LAB — POCT UA - MICROSCOPIC ONLY
Bacteria, U Microscopic: NEGATIVE
CRYSTALS, UR, HPF, POC: NEGATIVE
Yeast, UA: NEGATIVE

## 2014-03-31 MED ORDER — CIPROFLOXACIN 250 MG/5ML (5%) PO SUSR: 500.0000 mg | Freq: Two times a day (BID) | ORAL | Status: DC

## 2014-03-31 NOTE — Patient Instructions (Signed)

## 2014-03-31 NOTE — Progress Notes (Signed)
[facility] [address] [place] [address] [phone]  Date:  03/31/2014   Name:  Bailey King   DOB:  Mar 07, 1985   MRN:  [id number]  PCP:  No PCP Per Patient    Chief Complaint: Flank Pain; Dysuria; and Urinary Tract Infection   History of Present Illness:  Bailey King is a 29 y.o. very pleasant female patient who presents with the following:  Ill for a week with foul smelling, cloudy urine. Has frequency and urgency.  No dysuria No discharge. On depo so no menses No fever or chills No nausea or vomiting No stool change No improvement with over the counter medications or other home remedies.  Denies other complaint or health concern today.   Patient Active Problem List   Diagnosis Date Noted  . NVD (normal vaginal delivery) 08/16/2010    Past Medical History  Diagnosis Date  . NVD (normal vaginal delivery) 08/16/2010  . Fever of unknown origin 08/14/2010    Past Surgical History  Procedure Laterality Date  . Wisdom tooth extraction      History  Substance Use Topics  . Smoking status: Never Smoker   . Smokeless tobacco: Never Used  . Alcohol Use: No    No family history on file.  No Known Allergies  Medication list has been reviewed and updated.  Current Outpatient Prescriptions on File Prior to Visit  Medication Sig Dispense Refill  . nitrofurantoin (MACRODANTIN) 100 MG capsule Take 100 mg by mouth 4 (four) times daily.     No current facility-administered medications on file prior to visit.    Review of Systems:  As per HPI, otherwise negative.    Physical Examination: Filed Vitals:   03/31/14 0859  BP: 130/78  Pulse: 86  Temp: 99 F (37.2 C)  Resp: 12   Filed Vitals:   03/31/14 0859  Height:  (1.626 m)  Weight: 134 lb (60.782 kg)   Body mass index is 22.99 kg/(m^2). Ideal Body Weight: Weight in (lb) to have BMI = 25: 145.3  GEN: WDWN, NAD, Non-toxic, A & O x 3 HEENT: Atraumatic, Normocephalic.  Neck supple. No masses, No LAD. Ears and Nose: No external deformity. CV: RRR, No M/G/R. No JVD. No thrill. No extra heart sounds. PULM: CTA B, no wheezes, crackles, rhonchi. No retractions. No resp. distress. No accessory muscle use. ABD: S, NT, ND, +BS. No rebound. No HSM. EXTR: No c/c/e NEURO Normal gait.  PSYCH: Normally interactive. Conversant. Not depressed or anxious appearing.  Calm demeanor.    Assessment and Plan: Cystitis Pyridium cipro  Signed,  Phillips Odor, MD   Results for orders placed or performed in visit on 03/31/14  POCT urinalysis dipstick  Result Value Ref Range   Color, UA yellow    Clarity, UA hazy    Glucose, UA neg    Bilirubin, UA neg    Ketones, UA neg    Spec Grav, UA 1.020    Blood, UA tr-intact    pH, UA 7.0    Protein, UA neg    Urobilinogen, UA 1.0    Nitrite, UA neg    Leukocytes, UA small (1+)   POCT UA - Microscopic Only  Result Value Ref Range   WBC, Ur, HPF, POC 5-10    RBC, urine, microscopic 1-4    Bacteria, U Microscopic neg    Mucus, UA small    Epithelial cells, urine per micros 10-20    Crystals, Ur, HPF, POC  neg    Casts, Ur, LPF, POC granular    Yeast, UA neg    Amorphous small

## 2014-04-01 ENCOUNTER — Telehealth: Payer: Self-pay

## 2014-09-01 ENCOUNTER — Ambulatory Visit (INDEPENDENT_AMBULATORY_CARE_PROVIDER_SITE_OTHER): Payer: Managed Care, Other (non HMO) | Admitting: Family Medicine

## 2014-09-01 VITALS — BP 130/80 | HR 92 | Temp 98.1°F | Resp 16 | Ht 64.0 in | Wt 135.0 lb

## 2014-09-01 DIAGNOSIS — R8299 Other abnormal findings in urine: Secondary | ICD-10-CM | POA: Diagnosis not present

## 2014-09-01 DIAGNOSIS — R8290 Unspecified abnormal findings in urine: Secondary | ICD-10-CM | POA: Diagnosis not present

## 2014-09-01 DIAGNOSIS — N390 Urinary tract infection, site not specified: Secondary | ICD-10-CM | POA: Diagnosis not present

## 2014-09-01 DIAGNOSIS — A499 Bacterial infection, unspecified: Secondary | ICD-10-CM | POA: Diagnosis not present

## 2014-09-01 LAB — POCT URINALYSIS DIPSTICK
BILIRUBIN UA: NEGATIVE
GLUCOSE UA: NEGATIVE
Ketones, UA: NEGATIVE
NITRITE UA: POSITIVE
PH UA: 6.5
PROTEIN UA: NEGATIVE
Spec Grav, UA: 1.015
Urobilinogen, UA: 1

## 2014-09-01 LAB — POCT UA - MICROSCOPIC ONLY
CASTS, UR, LPF, POC: NEGATIVE
CRYSTALS, UR, HPF, POC: NEGATIVE
Mucus, UA: NEGATIVE
Yeast, UA: NEGATIVE

## 2014-09-01 MED ORDER — CEPHALEXIN 500 MG PO CAPS: 500.0000 mg | ORAL_CAPSULE | Freq: Two times a day (BID) | ORAL | Status: DC

## 2014-09-01 NOTE — Patient Instructions (Signed)

## 2014-09-01 NOTE — Progress Notes (Signed)
Chief Complaint:  Chief Complaint  Patient presents with  . Cloudy urine    Onset 3-4 days  . Foul odor    HPI: Bailey King is a 29 y.o. female who reports to [facility] today complaining of   History of some tingling with urination, any, malodorous urination and cloudy urine. She has had no fevers chills nausea vomiting. Or abdominal pain. She has had some right-sided flank pain.  She's tried cranberry juice and also takinglots of water.  Past Medical History  Diagnosis Date  . NVD (normal vaginal delivery) 08/16/2010  . Fever of unknown origin 08/14/2010   Past Surgical History  Procedure Laterality Date  . Wisdom tooth extraction     History   Social History  . Marital Status: Single    Spouse Name: N/A  . Number of Children: N/A  . Years of Education: N/A   Social History Main Topics  . Smoking status: Never Smoker   . Smokeless tobacco: Never Used  . Alcohol Use: No  . Drug Use: No  . Sexual Activity: Yes    Birth Control/ Protection: Patch   Other Topics Concern  . None   Social History Narrative   No family history on file. No Known Allergies Prior to Admission medications   Not on File     ROS: The patient denies fevers, chills, night sweats, unintentional weight loss, chest pain, palpitations, wheezing, dyspnea on exertion, nausea, vomiting, abdominal pain, , hematuria, melena, numbness, weakness, or tingling.   All other systems have been reviewed and were otherwise negative with the exception of those mentioned in the HPI and as above.    PHYSICAL EXAM: Filed Vitals:   09/01/14 1003  BP: 130/80  Pulse: 92  Temp: 98.1 F (36.7 C)  Resp: 16   Body mass index is 23.16 kg/(m^2).   General: Alert, no acute distress HEENT:  Normocephalic, atraumatic, oropharynx patent. EOMI, PERRLA Cardiovascular:  Regular rate and rhythm, no rubs murmurs or gallops.   Respiratory: Clear to auscultation bilaterally.  No wheezes, rales, or rhonchi.  No  cyanosis, no use of accessory musculature Abdominal: No organomegaly, abdomen is soft and non-tender, positive bowel sounds. No masses. Skin: No rashes. Neurologic: Facial musculature symmetric. Psychiatric: Patient acts appropriately throughout our interaction. Lymphatic: No cervical  lymphadenopathy Musculoskeletal: Gait intact.  NO CVA tenderness   LABS: Results for orders placed or performed in visit on 09/01/14  POCT UA - Microscopic Only  Result Value Ref Range   WBC, Ur, HPF, POC 3-5    RBC, urine, microscopic 2-5    Bacteria, U Microscopic 3+    Mucus, UA Negative    Epithelial cells, urine per micros 1-3    Crystals, Ur, HPF, POC Negative    Casts, Ur, LPF, POC Negative    Yeast, UA Negative   POCT urinalysis dipstick  Result Value Ref Range   Color, UA Light Amber    Clarity, UA Turbid    Glucose, UA Negative    Bilirubin, UA Negative    Ketones, UA Negative    Spec Grav, UA 1.015    Blood, UA Small    pH, UA 6.5    Protein, UA Negative    Urobilinogen, UA 1.0    Nitrite, UA Positive    Leukocytes, UA moderate (2+) (A) Negative     EKG/XRAY:   Primary read interpreted by Dr. Conley Rolls at [facility].   ASSESSMENT/PLAN: Encounter Diagnoses  Name Primary?  . Cloudy urine   .  Malodorous urine   . UTI (urinary tract infection), bacterial Yes   Push fluids Rx keflex Urine culture pending  Gross sideeffects, risk and benefits, and alternatives of medications d/w patient. Patient is aware that all medications have potential sideeffects and we are unable to predict every sideeffect or drug-drug interaction that may occur.  Bailey Brazill DO  09/01/2014 10:50 AM

## 2014-09-07 LAB — CYTOLOGY - PAP

## 2015-01-19 ENCOUNTER — Ambulatory Visit (INDEPENDENT_AMBULATORY_CARE_PROVIDER_SITE_OTHER): Payer: Managed Care, Other (non HMO) | Admitting: Physician Assistant

## 2015-01-19 VITALS — BP 116/80 | HR 80 | Temp 97.9°F | Resp 18 | Ht 65.0 in | Wt 145.4 lb

## 2015-01-19 DIAGNOSIS — R8299 Other abnormal findings in urine: Secondary | ICD-10-CM | POA: Diagnosis not present

## 2015-01-19 DIAGNOSIS — R8290 Unspecified abnormal findings in urine: Secondary | ICD-10-CM

## 2015-01-19 LAB — POCT URINALYSIS DIP (MANUAL ENTRY)
Bilirubin, UA: NEGATIVE
Blood, UA: NEGATIVE
Glucose, UA: NEGATIVE
Ketones, POC UA: NEGATIVE
NITRITE UA: NEGATIVE
PROTEIN UA: NEGATIVE
SPEC GRAV UA: 1.02
UROBILINOGEN UA: 1
pH, UA: 7.5

## 2015-01-19 LAB — POC MICROSCOPIC URINALYSIS (UMFC): Mucus: ABSENT

## 2015-01-19 MED ORDER — AMPICILLIN 250 MG/5ML PO SUSR: 500.0000 mg | Freq: Four times a day (QID) | ORAL | Status: AC

## 2015-01-19 NOTE — Progress Notes (Signed)
Patient ID: Bailey King, female    DOB: 04/17/1985, 29 y.o.   MRN: [id number]  PCP: No PCP Per Patient  Subjective:   Chief Complaint  Patient presents with  . Urinary Tract Infection    x1 week. Strange urine odor and cloudy urine. (if prescribed medication today, pt. requests liquid medication instead of tablets or capsules)    HPI Presents for evaluation of possible UTI.  Describes a change in urine odor and that her urine is cloudy. She is accompanied by her girlfriend, [place]Brianna.  Urinary urgency, with urge incontinence. No hematuria. No increased urinary frequency. No atypical vaginal discharge. Female sexual partner. Partner without symptoms. Use toys to insert in the vagina. No new products, like lubricants. Did have a finger, previously inserted in the anus, used to stimulate the clitoris. "This always happens when she does that. You can blame her."  Due to frequent UTI, her OBGYN referred her to urology. She was advised to use cranberry pills to prevent UTI, but she hasn't started taking it.  Her GYN usually gives her Ampicillin 250 mg/235ml 10 ml QID x 10 days.  Review of Systems  Constitutional: Negative.   Respiratory: Negative for cough, chest tightness and shortness of breath.   Cardiovascular: Negative for chest pain and palpitations.  Gastrointestinal: Negative for nausea, vomiting and diarrhea.  Genitourinary: Positive for urgency. Negative for dysuria, frequency, hematuria, flank pain, vaginal discharge, difficulty urinating, vaginal pain and dyspareunia.  Musculoskeletal: Negative for myalgias, back pain and arthralgias.  Hematological: Negative for adenopathy.       There are no active problems to display for this patient.    Prior to Admission medications   Not on File     No Known Allergies     Objective:  Physical Exam  Constitutional: She is oriented to person, place, and time. Vital signs are normal. She appears well-developed and  well-nourished. She is active and cooperative. No distress.  BP 116/80 mmHg  Pulse 80  Temp(Src) 97.9 F (36.6 C) (Oral)  Resp 18  Ht 5\' 5"  (1.651 m)  Wt 145 lb 6.4 oz (65.953 kg)  BMI 24.20 kg/m2  SpO2 97%  HENT:  Head: Normocephalic and atraumatic.  Right Ear: Hearing normal.  Left Ear: Hearing normal.  Eyes: Conjunctivae are normal. No scleral icterus.  Neck: Normal range of motion. Neck supple. No thyromegaly present.  Cardiovascular: Normal rate, regular rhythm and normal heart sounds.   Pulses:      Radial pulses are 2+ on the right side, and 2+ on the left side.  Pulmonary/Chest: Effort normal and breath sounds normal.  Lymphadenopathy:       Head (right side): No tonsillar, no preauricular, no posterior auricular and no occipital adenopathy present.       Head (left side): No tonsillar, no preauricular, no posterior auricular and no occipital adenopathy present.    She has no cervical adenopathy.       Right: No supraclavicular adenopathy present.       Left: No supraclavicular adenopathy present.  Neurological: She is alert and oriented to person, place, and time. No sensory deficit.  Skin: Skin is warm, dry and intact. No rash noted. No cyanosis or erythema. Nails show no clubbing.  Psychiatric: She has a normal mood and affect. Her speech is normal and behavior is normal.       Results for orders placed or performed in visit on 01/19/15  POCT Microscopic Urinalysis [facility])  Result Value Ref Range  WBC,UR,HPF,POC Moderate (A) None WBC/hpf   RBC,UR,HPF,POC None None RBC/hpf   Bacteria Moderate (A) None, Too numerous to count   Mucus Absent Absent   Epithelial Cells, UR Per Microscopy Moderate (A) None, Too numerous to count cells/hpf   Trichomonas Present   POCT urinalysis dipstick  Result Value Ref Range   Color, UA yellow yellow   Clarity, UA clear clear   Glucose, UA negative negative   Bilirubin, UA negative negative   Ketones, POC UA negative negative    Spec Grav, UA 1.020    Blood, UA negative negative   pH, UA 7.5    Protein Ur, POC negative negative   Urobilinogen, UA 1.0    Nitrite, UA Negative Negative   Leukocytes, UA small (1+) (A) Negative       Assessment & Plan:   1. Cloudy urine Symptoms consistent with previous culture positive UTI. Treat with previously effective regimen. Counseled on ways to minimize transfer of bacteria from the anorectal area to the vagina and urethral meatus, and to reduce the risk of UTI after intercourse. Await UCx. - POCT Microscopic Urinalysis ([facility]) - POCT urinalysis dipstick - Urine culture - ampicillin (PRINCIPEN) 250 MG/5ML suspension; Take 10 mLs (500 mg total) by mouth 4 (four) times daily.  Dispense: 400 mL; Refill: 0   Fernande Bras, PA-C Physician Assistant-Certified [facility]

## 2015-01-19 NOTE — Patient Instructions (Signed)
I will contact you with your lab results as soon as they are available.   If you have not heard from me in 2 weeks, please contact me.  The fastest way to get your results is to register for My Chart (see the instructions on the last page of this printout).   

## 2015-01-21 LAB — URINE CULTURE

## 2015-01-24 MED ORDER — SULFAMETHOXAZOLE-TRIMETHOPRIM 200-40 MG/5ML PO SUSP: 20.0000 mL | Freq: Two times a day (BID) | ORAL | Status: AC

## 2015-01-24 NOTE — Addendum Note (Signed)
Addended by: Fernande BrasJEFFERY, Porsha Skilton S on: 01/24/2015 11:23 AM   Modules accepted: Orders

## 2015-04-25 ENCOUNTER — Telehealth: Payer: Self-pay | Admitting: Family Medicine

## 2015-04-25 NOTE — Telephone Encounter (Signed)
Spoke with patient and she declined flu shot.  Noted in health maintenance.

## 2015-06-16 NOTE — Telephone Encounter (Signed)
close

## 2017-05-08 ENCOUNTER — Encounter: Payer: Self-pay | Admitting: Physician Assistant

## 2017-06-11 DIAGNOSIS — E7800 Pure hypercholesterolemia, unspecified: Secondary | ICD-10-CM | POA: Diagnosis not present

## 2017-06-11 DIAGNOSIS — I10 Essential (primary) hypertension: Secondary | ICD-10-CM | POA: Diagnosis not present

## 2017-06-11 DIAGNOSIS — N390 Urinary tract infection, site not specified: Secondary | ICD-10-CM | POA: Diagnosis not present

## 2017-06-17 DIAGNOSIS — I10 Essential (primary) hypertension: Secondary | ICD-10-CM | POA: Diagnosis not present

## 2017-06-17 DIAGNOSIS — Z0000 Encounter for general adult medical examination without abnormal findings: Secondary | ICD-10-CM | POA: Diagnosis not present

## 2017-12-19 DIAGNOSIS — M7042 Prepatellar bursitis, left knee: Secondary | ICD-10-CM | POA: Diagnosis not present

## 2017-12-19 DIAGNOSIS — M25562 Pain in left knee: Secondary | ICD-10-CM | POA: Diagnosis not present

## 2017-12-19 DIAGNOSIS — M7041 Prepatellar bursitis, right knee: Secondary | ICD-10-CM | POA: Diagnosis not present

## 2017-12-19 DIAGNOSIS — I10 Essential (primary) hypertension: Secondary | ICD-10-CM | POA: Diagnosis not present

## 2018-02-27 DIAGNOSIS — R202 Paresthesia of skin: Secondary | ICD-10-CM | POA: Diagnosis not present

## 2018-02-27 DIAGNOSIS — E7800 Pure hypercholesterolemia, unspecified: Secondary | ICD-10-CM | POA: Diagnosis not present

## 2018-02-27 DIAGNOSIS — I10 Essential (primary) hypertension: Secondary | ICD-10-CM | POA: Diagnosis not present

## 2018-02-27 DIAGNOSIS — M79609 Pain in unspecified limb: Secondary | ICD-10-CM | POA: Diagnosis not present

## 2018-03-19 DIAGNOSIS — Z01419 Encounter for gynecological examination (general) (routine) without abnormal findings: Secondary | ICD-10-CM | POA: Diagnosis not present

## 2018-03-19 DIAGNOSIS — Z6827 Body mass index (BMI) 27.0-27.9, adult: Secondary | ICD-10-CM | POA: Diagnosis not present

## 2018-03-19 DIAGNOSIS — N750 Cyst of Bartholin's gland: Secondary | ICD-10-CM | POA: Diagnosis not present

## 2018-03-19 DIAGNOSIS — Z01411 Encounter for gynecological examination (general) (routine) with abnormal findings: Secondary | ICD-10-CM | POA: Diagnosis not present

## 2018-04-22 DIAGNOSIS — A5900 Urogenital trichomoniasis, unspecified: Secondary | ICD-10-CM | POA: Diagnosis not present

## 2018-04-22 DIAGNOSIS — N760 Acute vaginitis: Secondary | ICD-10-CM | POA: Diagnosis not present

## 2018-06-18 DIAGNOSIS — Z0000 Encounter for general adult medical examination without abnormal findings: Secondary | ICD-10-CM | POA: Diagnosis not present

## 2018-06-18 DIAGNOSIS — I10 Essential (primary) hypertension: Secondary | ICD-10-CM | POA: Diagnosis not present

## 2018-06-24 DIAGNOSIS — Z0000 Encounter for general adult medical examination without abnormal findings: Secondary | ICD-10-CM | POA: Diagnosis not present

## 2018-11-06 DIAGNOSIS — R300 Dysuria: Secondary | ICD-10-CM | POA: Diagnosis not present

## 2018-11-06 DIAGNOSIS — M545 Low back pain: Secondary | ICD-10-CM | POA: Diagnosis not present

## 2018-12-09 DIAGNOSIS — N390 Urinary tract infection, site not specified: Secondary | ICD-10-CM | POA: Diagnosis not present

## 2018-12-09 DIAGNOSIS — Z0000 Encounter for general adult medical examination without abnormal findings: Secondary | ICD-10-CM | POA: Diagnosis not present

## 2018-12-09 DIAGNOSIS — E7800 Pure hypercholesterolemia, unspecified: Secondary | ICD-10-CM | POA: Diagnosis not present

## 2018-12-09 DIAGNOSIS — I10 Essential (primary) hypertension: Secondary | ICD-10-CM | POA: Diagnosis not present

## 2018-12-16 DIAGNOSIS — I10 Essential (primary) hypertension: Secondary | ICD-10-CM | POA: Diagnosis not present

## 2019-03-23 DIAGNOSIS — Z6826 Body mass index (BMI) 26.0-26.9, adult: Secondary | ICD-10-CM | POA: Diagnosis not present

## 2019-03-23 DIAGNOSIS — N750 Cyst of Bartholin's gland: Secondary | ICD-10-CM | POA: Diagnosis not present

## 2019-03-23 DIAGNOSIS — A599 Trichomoniasis, unspecified: Secondary | ICD-10-CM | POA: Diagnosis not present

## 2019-03-23 DIAGNOSIS — N760 Acute vaginitis: Secondary | ICD-10-CM | POA: Diagnosis not present

## 2019-03-23 DIAGNOSIS — Z01419 Encounter for gynecological examination (general) (routine) without abnormal findings: Secondary | ICD-10-CM | POA: Diagnosis not present

## 2019-05-03 ENCOUNTER — Ambulatory Visit
Admission: EM | Admit: 2019-05-03 | Discharge: 2019-05-03 | Disposition: A | Discharge status: Home / Self Care | Payer: BC Managed Care – PPO

## 2019-05-03 ENCOUNTER — Ambulatory Visit (INDEPENDENT_AMBULATORY_CARE_PROVIDER_SITE_OTHER): Payer: BC Managed Care – PPO

## 2019-05-03 DIAGNOSIS — S62356A Nondisplaced fracture of shaft of fifth metacarpal bone, right hand, initial encounter for closed fracture: Secondary | ICD-10-CM | POA: Diagnosis not present

## 2019-05-03 DIAGNOSIS — S62346A Nondisplaced fracture of base of fifth metacarpal bone, right hand, initial encounter for closed fracture: Secondary | ICD-10-CM | POA: Diagnosis not present

## 2019-05-03 DIAGNOSIS — M79641 Pain in right hand: Secondary | ICD-10-CM | POA: Diagnosis not present

## 2019-05-03 DIAGNOSIS — W1849XA Other slipping, tripping and stumbling without falling, initial encounter: Secondary | ICD-10-CM | POA: Diagnosis not present

## 2019-05-03 DIAGNOSIS — M7989 Other specified soft tissue disorders: Secondary | ICD-10-CM | POA: Diagnosis not present

## 2019-05-03 MED ORDER — TRAMADOL HCL 50 MG PO TABS: 50.0000 mg | ORAL_TABLET | Freq: Two times a day (BID) | ORAL | 0 refills | Status: AC | PRN

## 2019-05-03 NOTE — ED Triage Notes (Signed)
Pt states fell last night going up the stairs. C/o pain and swelling to rt hand. States took tylenol and iced her hand last night with some relief.

## 2019-05-03 NOTE — ED Provider Notes (Signed)
[facility]    CSN: [id number] Arrival date & time: 05/03/19  1732      History   Chief Complaint Chief Complaint  Patient presents with  . Hand Injury    HPI Bailey King is a 34 y.o. female.   34 year old female comes in for right hand pain after fall. States slipped going up the stairs, with impact onto right dorsal hand. Has had pain and swelling to the ulna hand. Denies numbness/tingling. Denies radiation of pain. Ice compress without relief.      Past Medical History:  Diagnosis Date  . Fever of unknown origin 08/14/2010  . NVD (normal vaginal delivery) 08/16/2010    There are no problems to display for this patient.   Past Surgical History:  Procedure Laterality Date  . WISDOM TOOTH EXTRACTION      OB History    Gravida  2   Para  2   Term  2   Preterm  0   AB      Living  2     SAB      TAB      Ectopic      Multiple      Live Births  1            Home Medications    Prior to Admission medications   Medication Sig Start Date End Date Taking? Authorizing Provider  losartan (COZAAR) 25 MG tablet Take 25 mg by mouth daily.   Yes [provider]  traMADol (ULTRAM) 50 MG tablet Take 1 tablet (50 mg total) by mouth every 12 (twelve) hours as needed for severe pain. 05/03/19   Belinda Fisher, PA-C    Family History History reviewed. No pertinent family history.  Social History Social History   Tobacco Use  . Smoking status: Never Smoker  . Smokeless tobacco: Never Used  Substance Use Topics  . Alcohol use: No    Alcohol/week: 0.0 standard drinks  . Drug use: No     Allergies   Patient has no known allergies.   Review of Systems Review of Systems  Reason unable to perform ROS: See HPI as above.     Physical Exam Triage Vital Signs ED Triage Vitals  Enc Vitals Group     BP 05/03/19 1740 (!) 152/96     Pulse Rate 05/03/19 1740 91     Resp 05/03/19 1740 16     Temp 05/03/19 1740 99 F (37.2 C)      Temp Source 05/03/19 1740 Oral     SpO2 05/03/19 1740 95 %     Weight --      Height --      Head Circumference --      Peak Flow --      Pain Score 05/03/19 1745 8     Pain Loc --      Pain Edu? --      Excl. in GC? --    No data found.  Updated Vital Signs BP (!) 152/96 (BP Location: Left Arm)   Pulse 91   Temp 99 F (37.2 C) (Oral)   Resp 16   LMP 04/13/2019   SpO2 95%   Physical Exam Constitutional:      General: She is not in acute distress.    Appearance: Normal appearance. She is well-developed. She is not toxic-appearing or diaphoretic.  HENT:     Head: Normocephalic and atraumatic.  Eyes:  Conjunctiva/sclera: Conjunctivae normal.     Pupils: Pupils are equal, round, and reactive to light.  Pulmonary:     Effort: Pulmonary effort is normal. No respiratory distress.     Comments: Speaking in full sentences without difficulty Musculoskeletal:     Cervical back: Normal range of motion and neck supple.     Comments: Swelling, contusion along 4th and 5th MCP. No tenderness along forearm, wrist, fingers. Tenderness along 4th and 5th MCP. Sensation intact. NVI.  Skin:    General: Skin is warm and dry.  Neurological:     Mental Status: She is alert and oriented to person, place, and time.      UC Treatments / Results  Labs (all labs ordered are listed, but only abnormal results are displayed) Labs Reviewed - No data to display  EKG   Radiology DG Hand Complete Right  Result Date: 05/03/2019 CLINICAL DATA:  Fall with swelling EXAM: RIGHT HAND - COMPLETE 3+ VIEW COMPARISON:  None. FINDINGS: Acute nondisplaced fracture involving the proximal shaft of the fifth metacarpal. There may be additional intra-articular fracture component at the base of the fifth metacarpal, best seen on the oblique view. No subluxation. No radiopaque foreign body. IMPRESSION: Acute, likely comminuted fracture involving the base and proximal shaft of fifth metacarpal with suspected  articular extension to the fifth CMC joint. No subluxation Electronically Signed   By: Donavan Foil M.D.   On: 05/03/2019 18:22    Procedures Procedures (including critical care time)  Medications Ordered in UC Medications - No data to display  Initial Impression / Assessment and Plan / UC Course  I have reviewed the triage vital signs and the nursing notes.  Pertinent labs & imaging results that were available during my care of the patient were reviewed by me and considered in my medical decision making (see chart for details).    Xray reviewed by me shows nondisplaced 5th MCP fracture of the base. Unfortunately without splinting supply in office. Will apply sam splint at this time. Symptomatic treatment with NSAIDS, tylenol. Tramadol as needed for further pain relief. Ice compress. Follow up with orthopedics for splinting and further monitoring. Return precautions given.  Discussed will call patient if xray read from radiology alters plan. Patient expresses understanding and agrees to plan.  Final Clinical Impressions(s) / UC Diagnoses   Final diagnoses:  Closed nondisplaced fracture of base of fifth metacarpal bone of right hand, initial encounter    ED Prescriptions    Medication Sig Dispense Auth. Provider   traMADol (ULTRAM) 50 MG tablet Take 1 tablet (50 mg total) by mouth every 12 (twelve) hours as needed for severe pain. 15 tablet Ok Edwards, PA-C     I have reviewed the PDMP during this encounter.   Ok Edwards, PA-C 05/03/19 1919

## 2019-05-03 NOTE — Discharge Instructions (Signed)
Start ibuprofen 800mg  three times a day. Tylenol 1000mg  three times a day. If needed, can supplement with tramadol for additional pain relief. Ice compress. Please call orthopedics tomorrow for follow up.

## 2019-05-05 DIAGNOSIS — S62346A Nondisplaced fracture of base of fifth metacarpal bone, right hand, initial encounter for closed fracture: Secondary | ICD-10-CM | POA: Diagnosis not present

## 2019-05-05 DIAGNOSIS — M79641 Pain in right hand: Secondary | ICD-10-CM | POA: Diagnosis not present

## 2019-05-17 DIAGNOSIS — Z113 Encounter for screening for infections with a predominantly sexual mode of transmission: Secondary | ICD-10-CM | POA: Diagnosis not present

## 2019-05-17 DIAGNOSIS — N760 Acute vaginitis: Secondary | ICD-10-CM | POA: Diagnosis not present

## 2019-05-20 DIAGNOSIS — S62346A Nondisplaced fracture of base of fifth metacarpal bone, right hand, initial encounter for closed fracture: Secondary | ICD-10-CM | POA: Diagnosis not present

## 2019-06-03 DIAGNOSIS — S62346A Nondisplaced fracture of base of fifth metacarpal bone, right hand, initial encounter for closed fracture: Secondary | ICD-10-CM | POA: Diagnosis not present

## 2019-06-23 DIAGNOSIS — I10 Essential (primary) hypertension: Secondary | ICD-10-CM | POA: Diagnosis not present

## 2019-06-23 DIAGNOSIS — Z0000 Encounter for general adult medical examination without abnormal findings: Secondary | ICD-10-CM | POA: Diagnosis not present

## 2019-06-23 DIAGNOSIS — E7800 Pure hypercholesterolemia, unspecified: Secondary | ICD-10-CM | POA: Diagnosis not present

## 2019-06-30 DIAGNOSIS — Z0000 Encounter for general adult medical examination without abnormal findings: Secondary | ICD-10-CM | POA: Diagnosis not present

## 2019-07-06 DIAGNOSIS — S52331A Displaced oblique fracture of shaft of right radius, initial encounter for closed fracture: Secondary | ICD-10-CM | POA: Diagnosis not present

## 2019-07-06 DIAGNOSIS — M25531 Pain in right wrist: Secondary | ICD-10-CM | POA: Diagnosis not present

## 2019-07-07 DIAGNOSIS — X58XXXA Exposure to other specified factors, initial encounter: Secondary | ICD-10-CM | POA: Diagnosis not present

## 2019-07-07 DIAGNOSIS — Y999 Unspecified external cause status: Secondary | ICD-10-CM | POA: Diagnosis not present

## 2019-07-07 DIAGNOSIS — S52371A Galeazzi's fracture of right radius, initial encounter for closed fracture: Secondary | ICD-10-CM | POA: Diagnosis not present

## 2019-07-07 DIAGNOSIS — G8918 Other acute postprocedural pain: Secondary | ICD-10-CM | POA: Diagnosis not present

## 2019-07-20 DIAGNOSIS — S52331D Displaced oblique fracture of shaft of right radius, subsequent encounter for closed fracture with routine healing: Secondary | ICD-10-CM | POA: Diagnosis not present

## 2019-07-20 DIAGNOSIS — M25631 Stiffness of right wrist, not elsewhere classified: Secondary | ICD-10-CM | POA: Diagnosis not present

## 2019-07-23 DIAGNOSIS — Z20822 Contact with and (suspected) exposure to covid-19: Secondary | ICD-10-CM | POA: Diagnosis not present

## 2019-08-03 DIAGNOSIS — S52331D Displaced oblique fracture of shaft of right radius, subsequent encounter for closed fracture with routine healing: Secondary | ICD-10-CM | POA: Diagnosis not present

## 2019-08-03 DIAGNOSIS — M25631 Stiffness of right wrist, not elsewhere classified: Secondary | ICD-10-CM | POA: Diagnosis not present

## 2019-08-11 DIAGNOSIS — M25631 Stiffness of right wrist, not elsewhere classified: Secondary | ICD-10-CM | POA: Diagnosis not present

## 2019-08-24 DIAGNOSIS — M25631 Stiffness of right wrist, not elsewhere classified: Secondary | ICD-10-CM | POA: Diagnosis not present

## 2019-09-21 DIAGNOSIS — S52331D Displaced oblique fracture of shaft of right radius, subsequent encounter for closed fracture with routine healing: Secondary | ICD-10-CM | POA: Diagnosis not present

## 2020-03-24 DIAGNOSIS — Z01419 Encounter for gynecological examination (general) (routine) without abnormal findings: Secondary | ICD-10-CM | POA: Diagnosis not present

## 2020-03-24 DIAGNOSIS — N750 Cyst of Bartholin's gland: Secondary | ICD-10-CM | POA: Diagnosis not present

## 2020-03-24 DIAGNOSIS — Z6827 Body mass index (BMI) 27.0-27.9, adult: Secondary | ICD-10-CM | POA: Diagnosis not present

## 2020-03-24 DIAGNOSIS — N760 Acute vaginitis: Secondary | ICD-10-CM | POA: Diagnosis not present

## 2020-08-01 DIAGNOSIS — Z0000 Encounter for general adult medical examination without abnormal findings: Secondary | ICD-10-CM | POA: Diagnosis not present

## 2020-08-08 DIAGNOSIS — E7800 Pure hypercholesterolemia, unspecified: Secondary | ICD-10-CM | POA: Diagnosis not present

## 2020-08-08 DIAGNOSIS — E559 Vitamin D deficiency, unspecified: Secondary | ICD-10-CM | POA: Diagnosis not present

## 2020-08-08 DIAGNOSIS — Z0000 Encounter for general adult medical examination without abnormal findings: Secondary | ICD-10-CM | POA: Diagnosis not present

## 2021-01-16 DIAGNOSIS — F4321 Adjustment disorder with depressed mood: Secondary | ICD-10-CM | POA: Diagnosis not present

## 2021-02-06 DIAGNOSIS — F4321 Adjustment disorder with depressed mood: Secondary | ICD-10-CM | POA: Diagnosis not present

## 2021-02-28 DIAGNOSIS — F4321 Adjustment disorder with depressed mood: Secondary | ICD-10-CM | POA: Diagnosis not present

## 2021-03-21 DIAGNOSIS — F4321 Adjustment disorder with depressed mood: Secondary | ICD-10-CM | POA: Diagnosis not present

## 2021-04-18 DIAGNOSIS — F4321 Adjustment disorder with depressed mood: Secondary | ICD-10-CM | POA: Diagnosis not present

## 2021-05-01 DIAGNOSIS — Z01419 Encounter for gynecological examination (general) (routine) without abnormal findings: Secondary | ICD-10-CM | POA: Diagnosis not present

## 2021-05-01 DIAGNOSIS — N760 Acute vaginitis: Secondary | ICD-10-CM | POA: Diagnosis not present

## 2021-05-01 DIAGNOSIS — Z6826 Body mass index (BMI) 26.0-26.9, adult: Secondary | ICD-10-CM | POA: Diagnosis not present

## 2021-05-08 DIAGNOSIS — F4321 Adjustment disorder with depressed mood: Secondary | ICD-10-CM | POA: Diagnosis not present

## 2021-05-30 DIAGNOSIS — F4321 Adjustment disorder with depressed mood: Secondary | ICD-10-CM | POA: Diagnosis not present

## 2021-07-04 DIAGNOSIS — F411 Generalized anxiety disorder: Secondary | ICD-10-CM | POA: Diagnosis not present

## 2021-08-08 DIAGNOSIS — F411 Generalized anxiety disorder: Secondary | ICD-10-CM | POA: Diagnosis not present

## 2021-08-21 DIAGNOSIS — Z0000 Encounter for general adult medical examination without abnormal findings: Secondary | ICD-10-CM | POA: Diagnosis not present

## 2021-08-21 DIAGNOSIS — E7800 Pure hypercholesterolemia, unspecified: Secondary | ICD-10-CM | POA: Diagnosis not present

## 2021-08-21 DIAGNOSIS — E559 Vitamin D deficiency, unspecified: Secondary | ICD-10-CM | POA: Diagnosis not present

## 2021-08-21 DIAGNOSIS — I10 Essential (primary) hypertension: Secondary | ICD-10-CM | POA: Diagnosis not present

## 2021-08-28 DIAGNOSIS — E7800 Pure hypercholesterolemia, unspecified: Secondary | ICD-10-CM | POA: Diagnosis not present

## 2021-08-28 DIAGNOSIS — Z0000 Encounter for general adult medical examination without abnormal findings: Secondary | ICD-10-CM | POA: Diagnosis not present

## 2021-08-28 DIAGNOSIS — I10 Essential (primary) hypertension: Secondary | ICD-10-CM | POA: Diagnosis not present

## 2021-08-28 DIAGNOSIS — E559 Vitamin D deficiency, unspecified: Secondary | ICD-10-CM | POA: Diagnosis not present

## 2021-09-05 DIAGNOSIS — F411 Generalized anxiety disorder: Secondary | ICD-10-CM | POA: Diagnosis not present

## 2021-10-02 DIAGNOSIS — F411 Generalized anxiety disorder: Secondary | ICD-10-CM | POA: Diagnosis not present

## 2021-10-30 DIAGNOSIS — F411 Generalized anxiety disorder: Secondary | ICD-10-CM | POA: Diagnosis not present

## 2021-11-28 DIAGNOSIS — F411 Generalized anxiety disorder: Secondary | ICD-10-CM | POA: Diagnosis not present

## 2021-12-20 DIAGNOSIS — F411 Generalized anxiety disorder: Secondary | ICD-10-CM | POA: Diagnosis not present

## 2022-01-15 DIAGNOSIS — F411 Generalized anxiety disorder: Secondary | ICD-10-CM | POA: Diagnosis not present

## 2022-01-21 IMAGING — DX DG HAND COMPLETE 3+V*R*
3 series · 3 of 3 positions shown · non-contrast
Comparison: None.

CLINICAL DATA: Fall with swelling

EXAM:
RIGHT HAND - COMPLETE 3+ VIEW

[hand pa]
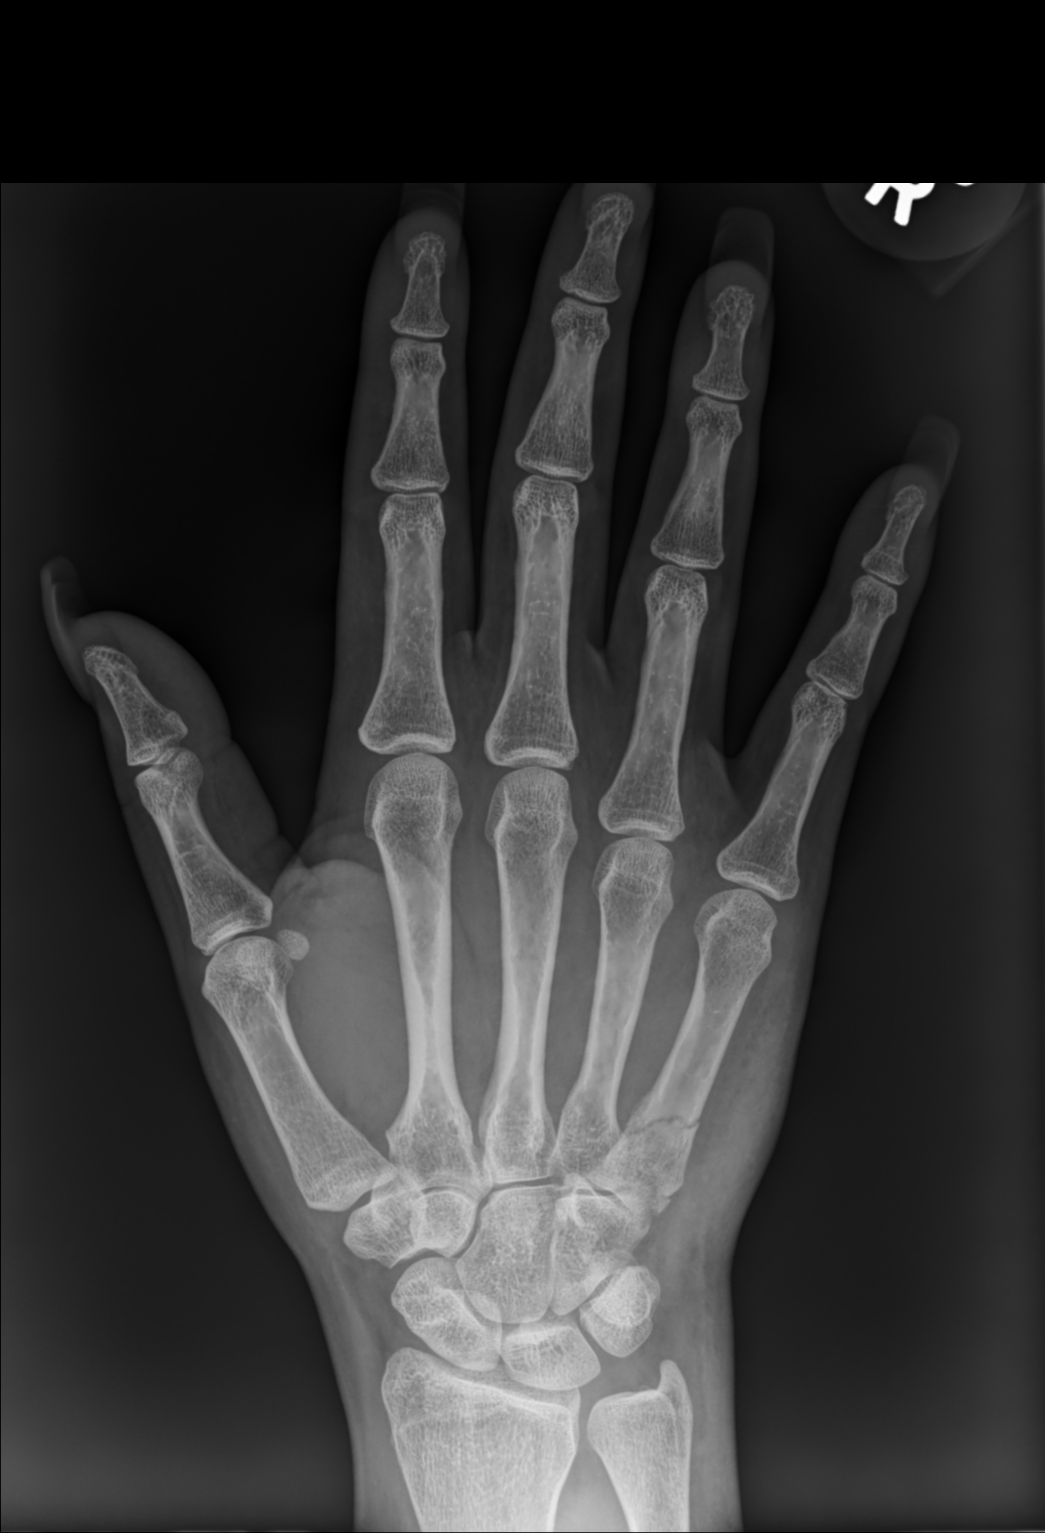

[hand mlo]
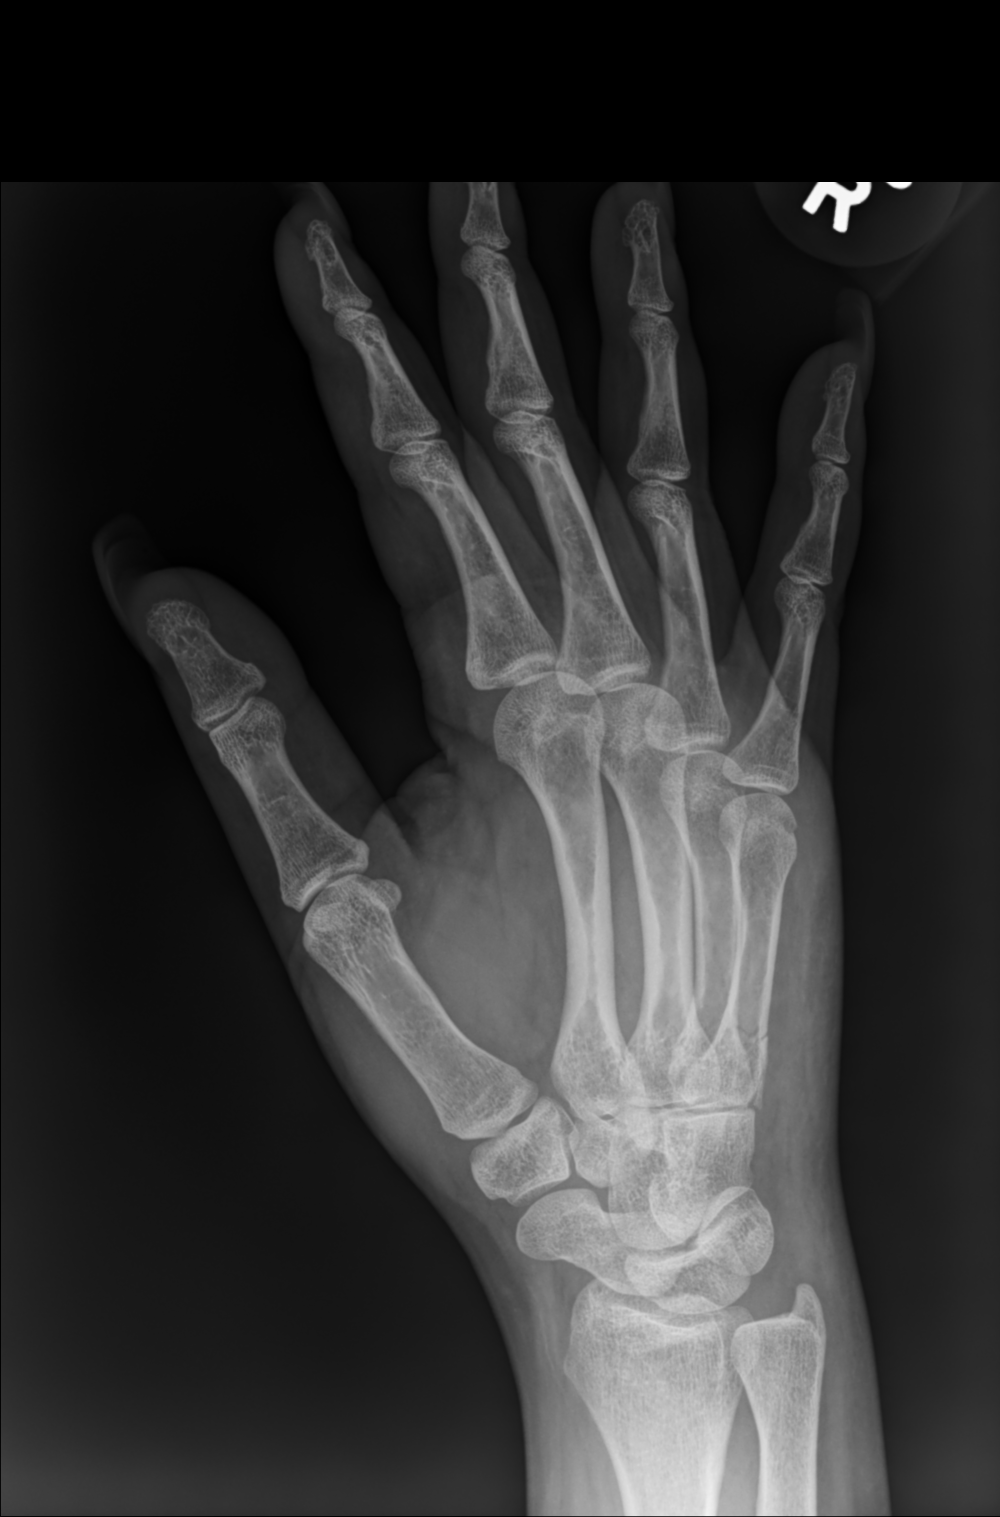

[hand lat]
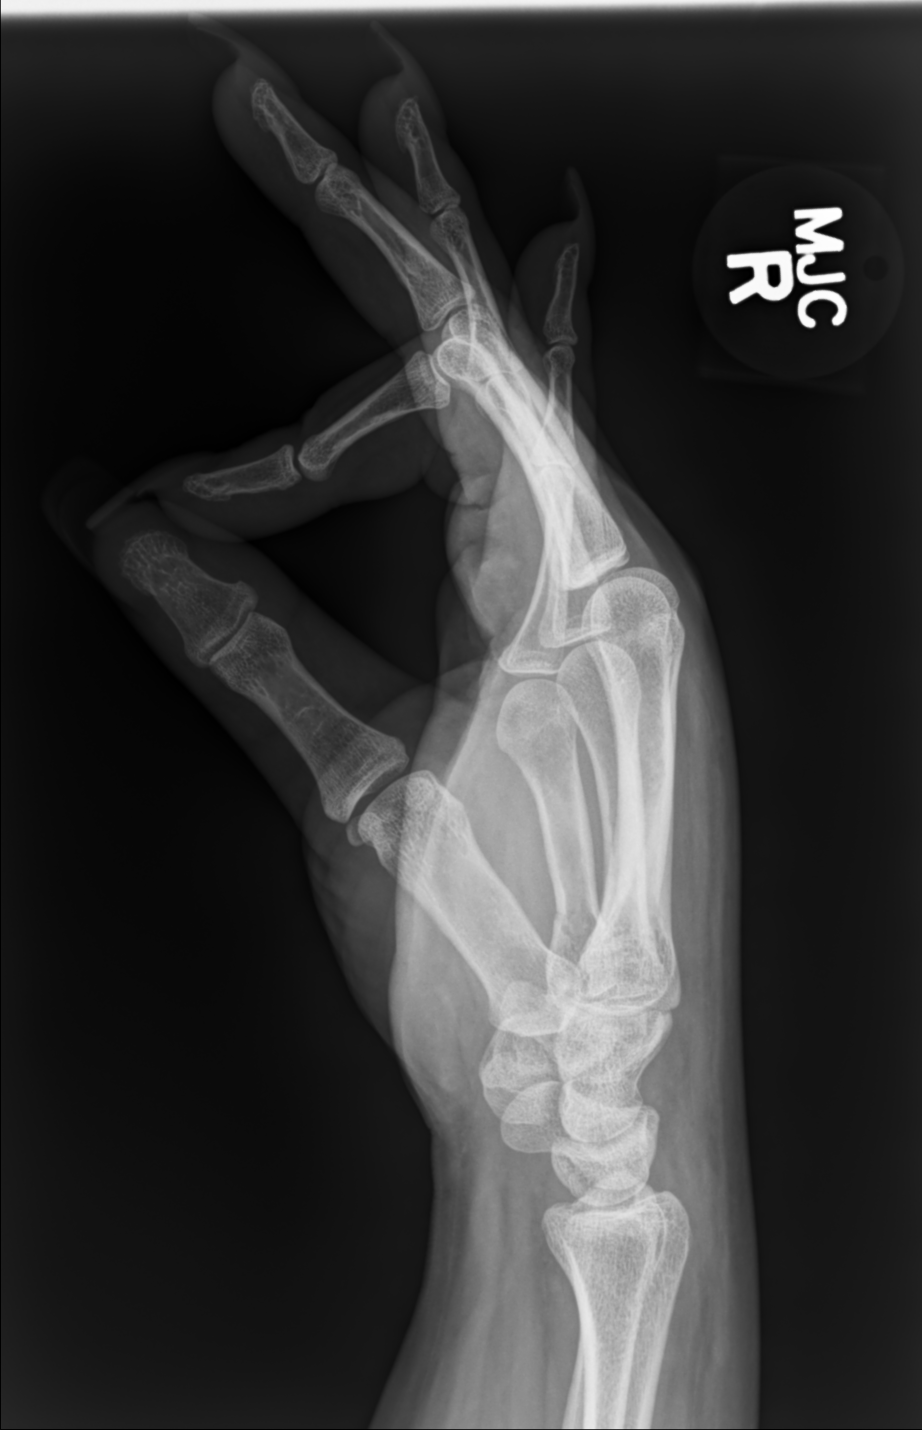

[3 of 3 positions shown; findings below may reference images not displayed]

FINDINGS: Acute nondisplaced fracture involving the proximal shaft of the
fifth metacarpal. There may be additional intra-articular fracture
component at the base of the fifth metacarpal, best seen on the
oblique view. No subluxation. No radiopaque foreign body.
IMPRESSION: Acute, likely comminuted fracture involving the base and proximal
shaft of fifth metacarpal with suspected articular extension to the
fifth CMC joint. No subluxation

## 2022-02-20 DIAGNOSIS — F411 Generalized anxiety disorder: Secondary | ICD-10-CM | POA: Diagnosis not present

## 2022-03-26 DIAGNOSIS — F411 Generalized anxiety disorder: Secondary | ICD-10-CM | POA: Diagnosis not present

## 2022-04-23 DIAGNOSIS — F411 Generalized anxiety disorder: Secondary | ICD-10-CM | POA: Diagnosis not present

## 2022-05-21 DIAGNOSIS — F411 Generalized anxiety disorder: Secondary | ICD-10-CM | POA: Diagnosis not present

## 2022-05-28 DIAGNOSIS — Z124 Encounter for screening for malignant neoplasm of cervix: Secondary | ICD-10-CM | POA: Diagnosis not present

## 2022-05-28 DIAGNOSIS — Z01419 Encounter for gynecological examination (general) (routine) without abnormal findings: Secondary | ICD-10-CM | POA: Diagnosis not present

## 2022-05-28 DIAGNOSIS — N760 Acute vaginitis: Secondary | ICD-10-CM | POA: Diagnosis not present

## 2022-05-28 DIAGNOSIS — Z6828 Body mass index (BMI) 28.0-28.9, adult: Secondary | ICD-10-CM | POA: Diagnosis not present

## 2022-06-19 DIAGNOSIS — F411 Generalized anxiety disorder: Secondary | ICD-10-CM | POA: Diagnosis not present

## 2022-06-25 DIAGNOSIS — Z1231 Encounter for screening mammogram for malignant neoplasm of breast: Secondary | ICD-10-CM | POA: Diagnosis not present

## 2022-08-20 DIAGNOSIS — F411 Generalized anxiety disorder: Secondary | ICD-10-CM | POA: Diagnosis not present

## 2022-08-27 DIAGNOSIS — R319 Hematuria, unspecified: Secondary | ICD-10-CM | POA: Diagnosis not present

## 2022-08-27 DIAGNOSIS — F419 Anxiety disorder, unspecified: Secondary | ICD-10-CM | POA: Diagnosis not present

## 2022-08-27 DIAGNOSIS — E7800 Pure hypercholesterolemia, unspecified: Secondary | ICD-10-CM | POA: Diagnosis not present

## 2022-08-27 DIAGNOSIS — I10 Essential (primary) hypertension: Secondary | ICD-10-CM | POA: Diagnosis not present

## 2022-08-27 DIAGNOSIS — E559 Vitamin D deficiency, unspecified: Secondary | ICD-10-CM | POA: Diagnosis not present

## 2022-09-03 DIAGNOSIS — Z23 Encounter for immunization: Secondary | ICD-10-CM | POA: Diagnosis not present

## 2022-09-03 DIAGNOSIS — Z0001 Encounter for general adult medical examination with abnormal findings: Secondary | ICD-10-CM | POA: Diagnosis not present

## 2022-09-11 DIAGNOSIS — I10 Essential (primary) hypertension: Secondary | ICD-10-CM | POA: Diagnosis not present

## 2022-09-17 DIAGNOSIS — F411 Generalized anxiety disorder: Secondary | ICD-10-CM | POA: Diagnosis not present

## 2022-10-18 DIAGNOSIS — I10 Essential (primary) hypertension: Secondary | ICD-10-CM | POA: Diagnosis not present

## 2022-10-22 DIAGNOSIS — F411 Generalized anxiety disorder: Secondary | ICD-10-CM | POA: Diagnosis not present

## 2022-11-19 DIAGNOSIS — F411 Generalized anxiety disorder: Secondary | ICD-10-CM | POA: Diagnosis not present

## 2022-12-17 DIAGNOSIS — F411 Generalized anxiety disorder: Secondary | ICD-10-CM | POA: Diagnosis not present

## 2023-01-22 DIAGNOSIS — F411 Generalized anxiety disorder: Secondary | ICD-10-CM | POA: Diagnosis not present

## 2023-02-19 DIAGNOSIS — F411 Generalized anxiety disorder: Secondary | ICD-10-CM | POA: Diagnosis not present

## 2023-04-23 DIAGNOSIS — F411 Generalized anxiety disorder: Secondary | ICD-10-CM | POA: Diagnosis not present

## 2023-05-20 DIAGNOSIS — F411 Generalized anxiety disorder: Secondary | ICD-10-CM | POA: Diagnosis not present

## 2023-05-26 DIAGNOSIS — E559 Vitamin D deficiency, unspecified: Secondary | ICD-10-CM | POA: Diagnosis not present

## 2023-06-20 ENCOUNTER — Ambulatory Visit
Admission: EM | Admit: 2023-06-20 | Discharge: 2023-06-20 | Disposition: A | Discharge status: Home / Self Care | Attending: Family Medicine | Admitting: Family Medicine

## 2023-06-20 DIAGNOSIS — H6993 Unspecified Eustachian tube disorder, bilateral: Secondary | ICD-10-CM | POA: Diagnosis not present

## 2023-06-20 MED ORDER — PREDNISONE 20 MG PO TABS: 40.0000 mg | ORAL_TABLET | Freq: Every day | ORAL | 0 refills | Status: AC

## 2023-06-20 NOTE — ED Triage Notes (Signed)
 Pt present with c/o bilateral ear pain x x tow days. Has used OTC ear drops.

## 2023-06-20 NOTE — ED Provider Notes (Signed)
 [facility]    CSN: [id number] Arrival date & time: 06/20/23  1700      History   Chief Complaint Chief Complaint  Patient presents with   Otalgia    HPI Bailey King is a 38 y.o. female presents for ear pain.  Patient reports 1 day of bilateral ear pain.  Denies any drainage, hearing changes, fevers, URI symptoms, allergy symptoms.  She has been using over-the-counter eardrops without improvement.  No other concerns at this time.   Otalgia   Past Medical History:  Diagnosis Date   Fever of unknown origin 08/14/2010   NVD (normal vaginal delivery) 08/16/2010    There are no active problems to display for this patient.   Past Surgical History:  Procedure Laterality Date   WISDOM TOOTH EXTRACTION      OB History     Gravida  2   Para  2   Term  2   Preterm  0   AB      Living  2      SAB      IAB      Ectopic      Multiple      Live Births  1            Home Medications    Prior to Admission medications   Medication Sig Start Date End Date Taking? Authorizing Provider  predniSONE (DELTASONE) 20 MG tablet Take 2 tablets (40 mg total) by mouth daily with breakfast for 5 days. 06/20/23 06/25/23 Yes Jonisha Kindig, Jodi R, NP  losartan (COZAAR) 25 MG tablet Take 25 mg by mouth daily.    [provider]  traMADol  (ULTRAM ) 50 MG tablet Take 1 tablet (50 mg total) by mouth every 12 (twelve) hours as needed for severe pain. 05/03/19   Alfrieda Antes, PA-C    Family History History reviewed. No pertinent family history.  Social History Social History   Tobacco Use   Smoking status: Never   Smokeless tobacco: Never  Substance Use Topics   Alcohol use: No    Alcohol/week: 0.0 standard drinks of alcohol   Drug use: No     Allergies   Patient has no known allergies.   Review of Systems Review of Systems  HENT:  Positive for ear pain.      Physical Exam Triage Vital Signs ED Triage Vitals [06/20/23 1709]  Encounter Vitals  Group     BP 131/82     Systolic BP Percentile      Diastolic BP Percentile      Pulse Rate 90     Resp 17     Temp 98.7 F (37.1 C)     Temp src      SpO2 94 %     Weight      Height      Head Circumference      Peak Flow      Pain Score      Pain Loc      Pain Education      Exclude from Growth Chart    No data found.  Updated Vital Signs BP 131/82 (BP Location: Right Arm)   Pulse 90   Temp 98.7 F (37.1 C)   Resp 17   LMP 06/14/2023 (Exact Date)   SpO2 94%   Visual Acuity Right Eye Distance:   Left Eye Distance:   Bilateral Distance:    Right Eye Near:   Left Eye Near:  Bilateral Near:     Physical Exam Vitals and nursing note reviewed.  Constitutional:      General: She is not in acute distress.    Appearance: Normal appearance. She is not ill-appearing.  HENT:     Head: Normocephalic and atraumatic.     Right Ear: A middle ear effusion is present. There is no impacted cerumen. No mastoid tenderness. Tympanic membrane is not erythematous.     Left Ear: A middle ear effusion is present. There is no impacted cerumen. No mastoid tenderness. Tympanic membrane is not erythematous.  Eyes:     Pupils: Pupils are equal, round, and reactive to light.  Cardiovascular:     Rate and Rhythm: Normal rate.  Pulmonary:     Effort: Pulmonary effort is normal.  Skin:    General: Skin is warm and dry.  Neurological:     General: No focal deficit present.     Mental Status: She is alert and oriented to person, place, and time.  Psychiatric:        Mood and Affect: Mood normal.        Behavior: Behavior normal.      UC Treatments / Results  Labs (all labs ordered are listed, but only abnormal results are displayed) Labs Reviewed - No data to display  EKG   Radiology No results found.  Procedures Procedures (including critical care time)  Medications Ordered in UC Medications - No data to display  Initial Impression / Assessment and Plan / UC Course   I have reviewed the triage vital signs and the nursing notes.  Pertinent labs & imaging results that were available during my care of the patient were reviewed by me and considered in my medical decision making (see chart for details).     Reviewed exam and symptoms with patient.  No red flags.  Advised patient to restart her over-the-counter allergy medicine daily for at least 7 to 14 days.  She states she could not do nasal sprays so we will do oral prednisone daily for 5 days.  Advised PCP follow-up if symptoms do not improve.  ER precautions reviewed and patient verbalized understanding. Final Clinical Impressions(s) / UC Diagnoses   Final diagnoses:  Eustachian tube dysfunction, bilateral     Discharge Instructions      Start allergy medicine such as Claritin or Zyrtec daily for at least 7 days.  Start prednisone daily for 5 days.  Follow-up with your PCP if your symptoms do not improve.  Please go to the ER for any worsening symptoms.  Hope you feel better soon!  ED Prescriptions     Medication Sig Dispense Auth. Provider   predniSONE (DELTASONE) 20 MG tablet Take 2 tablets (40 mg total) by mouth daily with breakfast for 5 days. 10 tablet Layken Doenges, Jodi R, NP      PDMP not reviewed this encounter.   Alleen Arbour, NP 06/20/23 1729

## 2023-06-20 NOTE — Discharge Instructions (Addendum)
 Start allergy medicine such as Claritin or Zyrtec daily for at least 7 days.  Start prednisone daily for 5 days.  Follow-up with your PCP if your symptoms do not improve.  Please go to the ER for any worsening symptoms.  Hope you feel better soon!

## 2023-06-25 DIAGNOSIS — F411 Generalized anxiety disorder: Secondary | ICD-10-CM | POA: Diagnosis not present

## 2023-07-01 DIAGNOSIS — N760 Acute vaginitis: Secondary | ICD-10-CM | POA: Diagnosis not present

## 2023-07-01 DIAGNOSIS — B9689 Other specified bacterial agents as the cause of diseases classified elsewhere: Secondary | ICD-10-CM | POA: Diagnosis not present

## 2023-07-01 DIAGNOSIS — Z01419 Encounter for gynecological examination (general) (routine) without abnormal findings: Secondary | ICD-10-CM | POA: Diagnosis not present

## 2023-07-01 DIAGNOSIS — Z6827 Body mass index (BMI) 27.0-27.9, adult: Secondary | ICD-10-CM | POA: Diagnosis not present

## 2023-09-02 ENCOUNTER — Other Ambulatory Visit (HOSPITAL_COMMUNITY): Payer: Self-pay | Admitting: Nurse Practitioner

## 2023-09-02 DIAGNOSIS — F411 Generalized anxiety disorder: Secondary | ICD-10-CM | POA: Diagnosis not present

## 2023-09-02 DIAGNOSIS — Z0000 Encounter for general adult medical examination without abnormal findings: Secondary | ICD-10-CM | POA: Diagnosis not present

## 2023-09-02 DIAGNOSIS — E213 Hyperparathyroidism, unspecified: Secondary | ICD-10-CM | POA: Diagnosis not present

## 2023-09-02 DIAGNOSIS — I10 Essential (primary) hypertension: Secondary | ICD-10-CM | POA: Diagnosis not present

## 2023-09-02 DIAGNOSIS — E559 Vitamin D deficiency, unspecified: Secondary | ICD-10-CM | POA: Diagnosis not present

## 2023-09-02 DIAGNOSIS — E7800 Pure hypercholesterolemia, unspecified: Secondary | ICD-10-CM | POA: Diagnosis not present

## 2023-09-10 ENCOUNTER — Encounter (HOSPITAL_COMMUNITY): Payer: Self-pay

## 2023-09-10 ENCOUNTER — Encounter (HOSPITAL_COMMUNITY)

## 2023-09-30 DIAGNOSIS — F411 Generalized anxiety disorder: Secondary | ICD-10-CM | POA: Diagnosis not present

## 2023-11-05 DIAGNOSIS — F411 Generalized anxiety disorder: Secondary | ICD-10-CM | POA: Diagnosis not present

## 2023-12-09 DIAGNOSIS — F411 Generalized anxiety disorder: Secondary | ICD-10-CM | POA: Diagnosis not present

## 2024-01-20 DIAGNOSIS — F411 Generalized anxiety disorder: Secondary | ICD-10-CM | POA: Diagnosis not present
